# Patient Record
Sex: Male | Born: 1935 | Race: Black or African American | Hispanic: No | Marital: Single | Smoking: Former smoker
Health system: Southern US, Community
[De-identification: ages and names within clinical notes are randomized; demographics above are authoritative.]

## PROBLEM LIST (undated history)

## (undated) DIAGNOSIS — R011 Cardiac murmur, unspecified: Secondary | ICD-10-CM

## (undated) DIAGNOSIS — E7800 Pure hypercholesterolemia, unspecified: Secondary | ICD-10-CM

## (undated) DIAGNOSIS — I2510 Atherosclerotic heart disease of native coronary artery without angina pectoris: Secondary | ICD-10-CM

## (undated) DIAGNOSIS — I10 Essential (primary) hypertension: Secondary | ICD-10-CM

## (undated) HISTORY — PX: APPENDECTOMY: SHX54

## (undated) SURGERY — Surgical Case
Anesthesia: *Unknown

---

## 2003-12-23 ENCOUNTER — Other Ambulatory Visit: Payer: Self-pay

## 2010-02-18 ENCOUNTER — Inpatient Hospital Stay: Payer: Self-pay | Admitting: Internal Medicine

## 2010-12-25 ENCOUNTER — Emergency Department: Payer: Self-pay | Admitting: Emergency Medicine

## 2011-01-17 ENCOUNTER — Emergency Department: Payer: Self-pay | Admitting: Emergency Medicine

## 2011-07-02 ENCOUNTER — Emergency Department: Payer: Self-pay | Admitting: *Deleted

## 2012-01-01 ENCOUNTER — Emergency Department: Payer: Self-pay | Admitting: Unknown Physician Specialty

## 2012-01-01 LAB — URINALYSIS, COMPLETE
Bilirubin,UR: NEGATIVE
Glucose,UR: NEGATIVE mg/dL (ref 0–75)
Ketone: NEGATIVE
Ph: 7 (ref 4.5–8.0)
Protein: NEGATIVE
Specific Gravity: 1.006 (ref 1.003–1.030)
Squamous Epithelial: 1

## 2012-01-01 LAB — COMPREHENSIVE METABOLIC PANEL
Alkaline Phosphatase: 72 U/L (ref 50–136)
Anion Gap: 9 (ref 7–16)
BUN: 20 mg/dL — ABNORMAL HIGH (ref 7–18)
Chloride: 104 mmol/L (ref 98–107)
Creatinine: 1.44 mg/dL — ABNORMAL HIGH (ref 0.60–1.30)
EGFR (Non-African Amer.): 49 — ABNORMAL LOW
Osmolality: 285 (ref 275–301)
Potassium: 4.5 mmol/L (ref 3.5–5.1)
SGPT (ALT): 20 U/L
Sodium: 141 mmol/L (ref 136–145)
Total Protein: 8.6 g/dL — ABNORMAL HIGH (ref 6.4–8.2)

## 2012-01-01 LAB — CBC
MCH: 30.4 pg (ref 26.0–34.0)
MCV: 92 fL (ref 80–100)
Platelet: 183 10*3/uL (ref 150–440)
RDW: 16.6 % — ABNORMAL HIGH (ref 11.5–14.5)

## 2012-01-01 LAB — TROPONIN I: Troponin-I: 0.04 ng/mL

## 2012-07-28 ENCOUNTER — Emergency Department: Payer: Self-pay | Admitting: Emergency Medicine

## 2012-07-28 LAB — BASIC METABOLIC PANEL
Anion Gap: 9 (ref 7–16)
Calcium, Total: 9.3 mg/dL (ref 8.5–10.1)
Chloride: 110 mmol/L — ABNORMAL HIGH (ref 98–107)
Co2: 22 mmol/L (ref 21–32)
EGFR (Non-African Amer.): 38 — ABNORMAL LOW
Glucose: 159 mg/dL — ABNORMAL HIGH (ref 65–99)
Osmolality: 288 (ref 275–301)
Sodium: 141 mmol/L (ref 136–145)

## 2012-07-28 LAB — CBC
HCT: 43.9 % (ref 40.0–52.0)
MCHC: 32.7 g/dL (ref 32.0–36.0)
RBC: 4.73 10*6/uL (ref 4.40–5.90)
RDW: 16 % — ABNORMAL HIGH (ref 11.5–14.5)
WBC: 4.7 10*3/uL (ref 3.8–10.6)

## 2012-08-18 ENCOUNTER — Emergency Department: Payer: Self-pay | Admitting: Emergency Medicine

## 2012-08-18 LAB — URINALYSIS, COMPLETE
Bacteria: NONE SEEN
Bilirubin,UR: NEGATIVE
Hyaline Cast: 4
Nitrite: NEGATIVE
Ph: 5 (ref 4.5–8.0)
Protein: 100
Specific Gravity: 1.024 (ref 1.003–1.030)
Squamous Epithelial: 8

## 2012-08-18 LAB — COMPREHENSIVE METABOLIC PANEL
Albumin: 3.4 g/dL (ref 3.4–5.0)
Alkaline Phosphatase: 100 U/L (ref 50–136)
Anion Gap: 10 (ref 7–16)
BUN: 21 mg/dL — ABNORMAL HIGH (ref 7–18)
Bilirubin,Total: 0.4 mg/dL (ref 0.2–1.0)
Calcium, Total: 9.3 mg/dL (ref 8.5–10.1)
Chloride: 110 mmol/L — ABNORMAL HIGH (ref 98–107)
Glucose: 128 mg/dL — ABNORMAL HIGH (ref 65–99)
Osmolality: 291 (ref 275–301)
Potassium: 4.4 mmol/L (ref 3.5–5.1)
Total Protein: 7.9 g/dL (ref 6.4–8.2)

## 2012-08-18 LAB — CBC
HGB: 14.3 g/dL (ref 13.0–18.0)
MCH: 29.8 pg (ref 26.0–34.0)
MCHC: 32 g/dL (ref 32.0–36.0)
MCV: 93 fL (ref 80–100)
Platelet: 211 10*3/uL (ref 150–440)
WBC: 5 10*3/uL (ref 3.8–10.6)

## 2012-08-18 LAB — CK TOTAL AND CKMB (NOT AT ARMC): CK, Total: 109 U/L (ref 35–232)

## 2013-04-29 ENCOUNTER — Emergency Department: Payer: Self-pay | Admitting: Emergency Medicine

## 2013-04-29 LAB — CBC
HCT: 41.3 % (ref 40.0–52.0)
HGB: 13.9 g/dL (ref 13.0–18.0)
MCHC: 33.7 g/dL (ref 32.0–36.0)
Platelet: 242 10*3/uL (ref 150–440)
RBC: 4.35 10*6/uL — ABNORMAL LOW (ref 4.40–5.90)
RDW: 16 % — ABNORMAL HIGH (ref 11.5–14.5)

## 2013-04-29 LAB — BASIC METABOLIC PANEL
Anion Gap: 5 — ABNORMAL LOW (ref 7–16)
BUN: 18 mg/dL (ref 7–18)
Calcium, Total: 9.7 mg/dL (ref 8.5–10.1)
Chloride: 108 mmol/L — ABNORMAL HIGH (ref 98–107)
Co2: 27 mmol/L (ref 21–32)
EGFR (Non-African Amer.): 36 — ABNORMAL LOW
Glucose: 182 mg/dL — ABNORMAL HIGH (ref 65–99)
Sodium: 140 mmol/L (ref 136–145)

## 2013-04-29 LAB — URINALYSIS, COMPLETE
Glucose,UR: 50 mg/dL (ref 0–75)
Ketone: NEGATIVE
Nitrite: NEGATIVE
Specific Gravity: 1.023 (ref 1.003–1.030)
WBC UR: 12 /HPF (ref 0–5)

## 2013-05-23 ENCOUNTER — Emergency Department: Payer: Self-pay | Admitting: Emergency Medicine

## 2013-05-23 LAB — CBC
MCH: 30.8 pg (ref 26.0–34.0)
MCHC: 32.8 g/dL (ref 32.0–36.0)
MCV: 94 fL (ref 80–100)
Platelet: 241 10*3/uL (ref 150–440)
RBC: 4.02 10*6/uL — ABNORMAL LOW (ref 4.40–5.90)
RDW: 15.3 % — ABNORMAL HIGH (ref 11.5–14.5)

## 2013-05-23 LAB — COMPREHENSIVE METABOLIC PANEL
Anion Gap: 7 (ref 7–16)
BUN: 18 mg/dL (ref 7–18)
Bilirubin,Total: 0.5 mg/dL (ref 0.2–1.0)
Chloride: 109 mmol/L — ABNORMAL HIGH (ref 98–107)
Co2: 24 mmol/L (ref 21–32)
Creatinine: 1.62 mg/dL — ABNORMAL HIGH (ref 0.60–1.30)
EGFR (African American): 41 — ABNORMAL LOW
Glucose: 93 mg/dL (ref 65–99)
Osmolality: 281 (ref 275–301)
Potassium: 4.2 mmol/L (ref 3.5–5.1)
Total Protein: 8 g/dL (ref 6.4–8.2)

## 2013-07-31 ENCOUNTER — Emergency Department: Payer: Self-pay | Admitting: Emergency Medicine

## 2013-07-31 LAB — URINALYSIS, COMPLETE
Bilirubin,UR: NEGATIVE
Ph: 7 (ref 4.5–8.0)
Protein: 30
RBC,UR: 6 /HPF (ref 0–5)
Squamous Epithelial: 1

## 2013-10-26 ENCOUNTER — Observation Stay: Payer: Self-pay | Admitting: Internal Medicine

## 2013-10-26 LAB — CBC
MCH: 31 pg (ref 26.0–34.0)
MCHC: 33 g/dL (ref 32.0–36.0)
MCV: 94 fL (ref 80–100)
Platelet: 203 10*3/uL (ref 150–440)
RBC: 3.87 10*6/uL — ABNORMAL LOW (ref 4.40–5.90)
RDW: 17.2 % — ABNORMAL HIGH (ref 11.5–14.5)

## 2013-10-26 LAB — COMPREHENSIVE METABOLIC PANEL
Albumin: 3.6 g/dL (ref 3.4–5.0)
Alkaline Phosphatase: 80 U/L
Anion Gap: 9 (ref 7–16)
Co2: 24 mmol/L (ref 21–32)
Glucose: 181 mg/dL — ABNORMAL HIGH (ref 65–99)
Osmolality: 289 (ref 275–301)
Sodium: 141 mmol/L (ref 136–145)
Total Protein: 7.6 g/dL (ref 6.4–8.2)

## 2013-10-26 LAB — TROPONIN I: Troponin-I: 0.05 ng/mL

## 2013-10-26 LAB — CK TOTAL AND CKMB (NOT AT ARMC): CK-MB: 1 ng/mL (ref 0.5–3.6)

## 2013-10-27 LAB — CK TOTAL AND CKMB (NOT AT ARMC)
CK, Total: 75 U/L (ref 35–232)
CK-MB: 1.7 ng/mL (ref 0.5–3.6)
CK-MB: 2.2 ng/mL (ref 0.5–3.6)

## 2013-10-27 LAB — LIPID PANEL: VLDL Cholesterol, Calc: 18 mg/dL (ref 5–40)

## 2013-10-27 LAB — TROPONIN I: Troponin-I: 0.08 ng/mL — ABNORMAL HIGH

## 2014-07-26 ENCOUNTER — Emergency Department: Payer: Self-pay | Admitting: Emergency Medicine

## 2014-08-29 ENCOUNTER — Emergency Department: Payer: Self-pay | Admitting: Emergency Medicine

## 2014-08-29 LAB — TROPONIN I
TROPONIN-I: 0.02 ng/mL
Troponin-I: <0.02 ng/mL

## 2014-08-29 LAB — BASIC METABOLIC PANEL
Anion Gap: 7 (ref 7–16)
BUN: 20 mg/dL — ABNORMAL HIGH (ref 7–18)
Calcium, Total: 8.3 mg/dL — ABNORMAL LOW (ref 8.5–10.1)
Chloride: 113 mmol/L — ABNORMAL HIGH (ref 98–107)
Co2: 26 mmol/L (ref 21–32)
Creatinine: 1.39 mg/dL — ABNORMAL HIGH (ref 0.60–1.30)
EGFR (African American): >60
EGFR (Non-African Amer.): 50 — ABNORMAL LOW
GLUCOSE: 130 mg/dL — AB (ref 65–99)
Osmolality: 295 (ref 275–301)
Potassium: 3.8 mmol/L (ref 3.5–5.1)
Sodium: 146 mmol/L — ABNORMAL HIGH (ref 136–145)

## 2014-08-29 LAB — CBC
HCT: 36.5 % — ABNORMAL LOW (ref 40.0–52.0)
HGB: 11.9 g/dL — ABNORMAL LOW (ref 13.0–18.0)
MCH: 31.7 pg (ref 26.0–34.0)
MCHC: 32.7 g/dL (ref 32.0–36.0)
MCV: 97 fL (ref 80–100)
PLATELETS: 190 10*3/uL (ref 150–440)
RBC: 3.77 10*6/uL — ABNORMAL LOW (ref 4.40–5.90)
RDW: 17 % — ABNORMAL HIGH (ref 11.5–14.5)
WBC: 5.4 10*3/uL (ref 3.8–10.6)

## 2014-08-29 LAB — PROTIME-INR
INR: 1
PROTHROMBIN TIME: 12.8 s (ref 11.5–14.7)

## 2014-10-11 ENCOUNTER — Emergency Department: Payer: Self-pay | Admitting: Internal Medicine

## 2015-01-18 ENCOUNTER — Emergency Department: Payer: Self-pay | Admitting: Emergency Medicine

## 2015-03-11 ENCOUNTER — Observation Stay
Admit: 2015-03-11 | Disposition: A | Discharge status: Home / Self Care | Payer: Self-pay | Attending: Internal Medicine | Admitting: Internal Medicine

## 2015-03-11 LAB — BASIC METABOLIC PANEL
Anion Gap: 5 — ABNORMAL LOW (ref 7–16)
BUN: 35 mg/dL — ABNORMAL HIGH
CALCIUM: 9.3 mg/dL
CHLORIDE: 107 mmol/L
Co2: 27 mmol/L
Creatinine: 1.64 mg/dL — ABNORMAL HIGH
GFR CALC AF AMER: 41 — AB
GFR CALC NON AF AMER: 35 — AB
Glucose: 110 mg/dL — ABNORMAL HIGH
Potassium: 4 mmol/L
SODIUM: 139 mmol/L

## 2015-03-11 LAB — URINALYSIS, COMPLETE
Bilirubin,UR: NEGATIVE
Blood: NEGATIVE
GLUCOSE, UR: NEGATIVE mg/dL (ref 0–75)
Ketone: NEGATIVE
Nitrite: NEGATIVE
Ph: 5 (ref 4.5–8.0)
Protein: 30
Specific Gravity: 1.025 (ref 1.003–1.030)

## 2015-03-11 LAB — CK TOTAL AND CKMB (NOT AT ARMC)
CK, Total: 74 U/L
CK-MB: 3.4 ng/mL

## 2015-03-11 LAB — CBC
HCT: 38.9 % — ABNORMAL LOW (ref 40.0–52.0)
HGB: 12.7 g/dL — ABNORMAL LOW (ref 13.0–18.0)
MCH: 30.5 pg (ref 26.0–34.0)
MCHC: 32.7 g/dL (ref 32.0–36.0)
MCV: 93 fL (ref 80–100)
Platelet: 206 10*3/uL (ref 150–440)
RBC: 4.17 10*6/uL — AB (ref 4.40–5.90)
RDW: 17.3 % — ABNORMAL HIGH (ref 11.5–14.5)
WBC: 6.6 10*3/uL (ref 3.8–10.6)

## 2015-03-11 LAB — PRO B NATRIURETIC PEPTIDE: B-Type Natriuretic Peptide: 77 pg/mL

## 2015-03-11 LAB — TROPONIN I
TROPONIN-I: 0.03 ng/mL
Troponin-I: 0.03 ng/mL
Troponin-I: 0.03 ng/mL

## 2015-03-13 LAB — URINE CULTURE

## 2015-03-14 NOTE — Discharge Summary (Signed)
PATIENT NAME:  Victor Dillon, Victor Dillon MR#:  [id number] DATE OF BIRTH:  [birth year]  DATE OF ADMISSION:  10/26/2013 DATE OF DISCHARGE:  10/27/2013  ADMITTING DIAGNOSIS: Chest pain.   DISCHARGE DIAGNOSES: 1.  Chest pain felt to be atypical in nature, seen by Cardiology who recommended outpatient follow-up.  2.  Hyperlipidemia.  3.  Hypertension.  4.  Benign prostatic hypertrophy.  5.  History of coronary artery disease with 40% lesion in the LAD found in 2005.   CONSULTANTS: Dr. Ubaldo Glassing.   PERTINENT LABORATORY EVALUATION:  Admitting glucose 181, BUN 21, creatinine 1.70, sodium 141, potassium 3.8, chloride 108, CO2 24, calcium 9.4. LFTs are normal. Troponin was 0.03, 0.05 and 0.08. WBC count of 6.0, hemoglobin 12, platelet count 203. Chest x-ray shows mild right basilar atelectasis. EKG shows sinus tachycardia with a heart rate of 103 with lateral T wave inversions.   HOSPITAL COURSE: Please refer to H and P done by the admitting physician. The patient is a 81 year old African American male with history of hypertension, hyperlipidemia, coronary artery disease, who presented with chest pain. The patient described it as a retrosternal pain which was brief in nature. The patient came to the ED and he was monitored. He had sets of cardiac enzymes and his troponin did come back a little elevated. Therefore, he was admitted under observation. Cardiology consult was obtained. They evaluated the patient and felt that he likely had atypical pain and with his elevated creatinine, he would not be a good candidate for intervention anyway.  Therefore recommended medical management. The patient has not had any further chest pain. He is doing well. He is stable for discharge.   DISCHARGE MEDICATIONS: Amlodipine 10 daily, simvastatin 20 at bedtime, aspirin 81 one tab p.o. daily, Colace and Senna 1 tab p.o. b.i.d. as needed, terazosin 5 mg at bedtime, acetaminophen 650 t.i.d. as needed for pain, calcium with vitamin D 1 tab  p.o. b.i.d. capsaicin topical   topically apply to left knee b.i.d. for arthritis, lubricant eyedrops 1 drop to each eye once a day in the evening, finasteride 2 mg daily, lidocaine topical 5% daily, hydralazine 50, one tab p.o. t.i.d., lisinopril 20 daily, vitamin D3 1000 international units daily, metoprolol tartrate 25 mg 1 tab p.o. b.i.d., nitroglycerin 0.4 sublingual p.r.n.   DIET: Low sodium.   ACTIVITY: As tolerated.   FOLLOW-UP:  With primary M.D. in 1 to 2 weeks. Follow up with Dr. Ubaldo Glassing in 2 to 4 weeks.   TIME SPENT:  35 minutes.  ____________________________ Lafonda Mosses Posey Pronto, MD shp:dp D: 10/27/2013 13:00:13 ET T: 10/27/2013 16:48:16 ET JOB#: [id number]  cc: Finnley Larusso H. Posey Pronto, MD, <Dictator> Alric Seton MD ELECTRONICALLY SIGNED 11/02/2013 12:48

## 2015-03-14 NOTE — Consult Note (Signed)
Present Illness 81 yo male followed at the [facility] admitted with chest pain and mild serum troponin of 0.03, 0.05 and 0.08. Urgent cardiology consultation called by ward secretary to see patinet regarding his tropoinin and chest pain. Pt had cardiac catheterization in 2005 per Dr. Andi Devon revealing insignificant disease with normal rca, lcx and lm with a less than 50% stenosis in lad. Pt decribed 8/10 chest pain. Pain has resolved. EKG is unremarkable with no ischemia noted. Pt states the pain lasted approximately one hour and occurred after eating dinner. He states he had eaten a pork chop sandwich and following this had approximately one hour of chest pain. It was in the center of his chest and did not radiate. He had not felt pain similar to this in the past. He states he is compliant with his meds. Risk factors for cad include hypertension, hyperlipidemia. He had runs of svt in the er. He is currently pain free. He is able to ambulate with no chest pain.   Physical Exam:  GEN well developed, well nourished, no acute distress   HEENT PERRL, poor dentition   NECK supple    RESP clear BS  no use of accessory muscles    CARD Regular rate and rhythm  No murmur    ABD denies tenderness  normal BS  no Adominal Mass    LYMPH negative neck, negative axillae   EXTR negative cyanosis/clubbing, negative edema   SKIN normal to palpation   NEURO cranial nerves intact, motor/sensory function intact   PSYCH A+O to time, place, person   Review of Systems:  Subjective/Chief Complaint one hour of chest pain   General: No Complaints    Skin: No Complaints    ENT: No Complaints    Eyes: No Complaints    Neck: No Complaints    Respiratory: No Complaints    Cardiovascular: No Complaints    Gastrointestinal: No Complaints    Genitourinary: No Complaints    Vascular: No Complaints    Musculoskeletal: No Complaints    Neurologic: No Complaints    Hematologic: No Complaints     Endocrine: No Complaints    Psychiatric: No Complaints    Review of Systems: All other systems were reviewed and found to be negative    Medications/Allergies Reviewed Medications/Allergies reviewed    Home Medications:  Medication Instructions Status  docusate-senna 50 mg-8.6 mg oral tablet 1 tab(s) orally 2 times a day as needed for constipation Active  terazosin 1 mg oral capsule 5 caps (75m) orally once a day (at bedtime) Active  acetaminophen 325 mg oral tablet 2 tabs (6546m orally 3 times a day Active  Calcarb with D 600 mg-400 intl units oral tablet 1 tab(s) orally 2 times a day Active  capsaicin topical 0.075% topical cream Apply topically to left knee 2 times a dayfor arthritis pain. Active  Lubricant Eye Drops 1 application (thin ribbon) of ointment to each eye once a day (in the evening) Active  amlodipine 10 mg oral tablet 1 tab(s) orally once a day for blood pressure Active  simvastatin 40 mg oral tablet 0.5 tab (2020morally once a day (at bedtime) for cholesterol Active  aspirin 81 mg oral delayed release tablet 1 tab(s) orally once a day Active  finasteride 1 mg oral tablet 2 tabs (2mg50mrally once a day Active  lidocaine topical 5% topical ointment Apply moderate amount topically  once a day Active  hydrALAZINE 50 mg oral tablet 1 tab(s) orally 3  times a day Active  lisinopril 40 mg oral tablet 0.5 tabs (76m) orally once a day Active  Vitamin D3 1000 intl units oral tablet 1 tab(s) orally once a day Active   EKG:  EKG NSR    Abnormal NSSTTW changes     No Known Allergies:    Impression 81yo male with history of hypertension, hyperlipidemia and insignificant coronary artery disease by cardiac cath in 2005 who is followed at the [facility]. Was admitted after presenting to the er with one hour of chst pain occurring after eating. Described the pain as midsternal without radiation No diapharesis, nausea or vomiting. Minimal tropinin elevation to 0.08. Had brief runs  of nonsustained svt in er. Also has CKD with eGFR of 39. EKG does not reveal any significant ischemia. Has had no further arrythmia since admission. Pain is somewhat atypical and may represent esophageal spasm due to high fat food intake. Elevated werum tropoin is of some concern but likely represents demand ischeia given symtpoms as well as a component of CKD. Would not recommend invasive cardiac workup given symtpoms, troponin and renal insuffiency. Will continue to treat with asa, statin and add low dose beta blocker. Ambulate in hall and consider discharge if stable with out patinet follow up with his primary cardiologist at the [facility] or inour office.   Plan 1. Continue current meds including asa, statin, ace i, and beta blockers 2. Ambulate and follow for further symptoms or arrythmia 3.If stable, would consider discharge with outpatient follow up with his primary cardiologist or in our office if desired.   Electronic Signatures: FTeodoro Spray(MD)  (Signed 06-Dec-14 07:57)  Authored: General Aspect/Present Illness, History and Physical Exam, Review of System, Home Medications, EKG , Allergies, Impression/Plan   Last Updated: 06-Dec-14 07:57 by FTeodoro Spray(MD)

## 2015-03-14 NOTE — H&P (Signed)
PATIENT NAME:  Victor Dillon, Victor Dillon MR#:  [id number] DATE OF BIRTH:  12/20/1916  DATE OF ADMISSION:  10/26/2013  REFERRING PHYSICIAN: Dr. Corky Downs.   PRIMARY CARE PHYSICIAN:  [place].   CHIEF COMPLAINT: Chest pain.   This is a 81 year old African American gentleman with history of hypertension, hyperlipidemia, coronary artery disease, who is presenting with chest pain. Described this as a retrosternal pain, pressure and quality, 8 out of 10 in intensity and nonradiating. No worsening or relieving factors without associated symptoms, including shortness of breath, palpitations, diaphoresis and nausea. This pain lasted approximately 1 hour in total. It started acutely as he just finished eating dinner. He denies having any prior chest pain symptoms, any exertional symptoms or any anginal symptoms. In the Emergency Department, initial EKG and enzymes were within normal limits. However, he did have runs of sinus tachycardia, heart rate up to 150. He is currently without complaints.   REVIEW OF SYSTEMS:  CONSTITUTIONAL: Denies fever, fatigue, weakness, pain.  EYES: Denies blurred vision, double vision, eye pain.  EARS, NOSE, THROAT: Denies tinnitus, ear pain, hearing loss.  RESPIRATORY: Denies cough, wheeze, shortness of breath.  CARDIOVASCULAR: Positive for chest pain as described above. Denies any orthopnea, edema, palpitations.  GASTROINTESTINAL: Denies nausea, vomiting, diarrhea, abdominal pain.  GENITOURINARY: Denies dysuria, hematuria.  ENDOCRINE: Denies nocturia or polyuria.  HEMATOLOGIC AND LYMPHATIC: Denies easy bruising or bleeding.  SKIN: Denies any rashes, lesions.  MUSCULOSKELETAL: Positive for chronic pain in his left knee, otherwise denies any neck, back, shoulder or hip pain, any current arthritic symptoms.  NEUROLOGIC: Denies any paralysis, paresthesias.  PSYCHIATRIC: Denies any anxiety or depressive symptoms. Otherwise, full review of systems performed by me is negative.   PAST  MEDICAL HISTORY: Hyperlipidemia, hypertension, BPH, coronary artery disease with known 40% lesion of the LAD, originally found back in 2005 on cardiac catheterization, per our records.   FAMILY HISTORY: Denies any history of cardiovascular disease including coronary artery disease.   SOCIAL HISTORY: Ex-tobacco use, has not smoked in approximately 50 years. Denies any alcohol or drug usage. Currently resides in an assisted living facility. Ambulates with a walker at baseline.   ALLERGIES: No known drug allergies.   HOME MEDICATIONS: Include acetaminophen 325 mg 2 tablets 3 times daily, Norvasc 10 mg p.o. daily, calcium 600 mg/vitamin D 400 International Units 1 tab p.o. b.i.d., capsaicin 0.075% cream to knee as needed for arthritic symptoms, Colace, Senna-S 8.6/50 mg p.o. b.i.d. for constipation, finasteride 1 mg 2 tablets p.o. daily, , simvastatin 20 mg p.o. daily, terazosin 5 mg p.o. at bedtime, hydralazine 50 mg p.o. 3 times daily, lisinopril 20 mg p.o. daily.   PHYSICAL EXAMINATION: VITAL SIGNS: Temperature 97.6, heart rate 102, respirations 16, blood pressure 142/83, saturating 98% on room air. Weight 72.6 kg, BMI 24.3.  GENERAL: Well-nourished, well-developed, African American gentleman who is currently in no acute distress.  HEAD: Normocephalic, atraumatic.  EYES: Pupils equal, round and reactive to light.  Extraocular muscles intact. No scleral icterus.  MOUTH: Moist mucosal membranes. Poor dentition. No abscess noted.  EAR, NOSE, THROAT: Throat clear without exudates. No external lesions.  NECK: Supple. No thyromegaly. No nodules. No JVD.  PULMONARY: Clear to auscultation bilaterally. No wheezes, rubs or rhonchi. No use of accessory muscles. Good respiratory effort.  CHEST: Nontender to palpation.  CARDIOVASCULAR: S1, S2, regular rate and rhythm, 3/6 systolic ejection murmur, best heard at right upper sternal border. No edema. Pedal pulses 2+ bilaterally.  GASTROINTESTINAL: Soft,  nontender, nondistended. No masses. Positive  bowel sounds. No hepatosplenomegaly.  MUSCULOSKELETAL: No swelling, clubbing or edema. Range of motion full in all extremities.  NEUROLOGIC: Cranial nerves II through XII intact. No gross focal neurological deficits. Sensation intact. Reflexes intact.  SKIN: No ulcerations, lesions, rashes or cyanosis.  SKIN:  Warm, dry. Turgor is intact.  PSYCHIATRIC: Mood and affect within normal limits. The patient awake, alert and oriented x 3. Insight and judgment intact.   LABORATORY DATA: Sodium 141, potassium 3.8, chloride 108, bicarb 24, BUN 21, creatinine 1.7, glucose 181. LFTs within normal limits. Troponin I 0.03. WBC 6, hemoglobin 12, platelets of 203.   EKG: Sinus tachycardia, heart rate of 103 with lateral T inversions, which appear unchanged from previous EKGs.    ASSESSMENT AND PLAN: A 81 year old African American gentleman with history of hypertension, hyperlipidemia, coronary artery disease, presenting with chest pain.  1.  Chest pain. EKG and enzymes within normal limits. We will admit to observation with cardiac telemetry. Trend cardiac enzymes. Check lipids. He has already been given aspirin.  We will continue aspirin, as well as his statin.  2.  Hypertension. Continue with his Norvasc, lisinopril, Lopressor and hydralazine.  3.  Hyperlipidemia. Continue with Zocor.  4.  Benign prostatic hypertrophy. Continue Proscar and terazosin.  5. Deep venous thrombosis prophylaxis with heparin subQ.   The patient is FULL CODE.   TIME SPENT: 45 minutes.    ____________________________ Aaron Mose. Danish Ruffins, MD dkh:dmm D: 10/26/2013 22:24:53 ET T: 10/26/2013 22:39:46 ET JOB#: [id number]  cc: Aaron Mose. Leaira Fullam, MD, <Dictator> Lavaun Greenfield Woodfin Ganja MD ELECTRONICALLY SIGNED 10/27/2013 20:35

## 2015-03-23 NOTE — H&P (Signed)
PATIENT NAME:  Victor Dillon, Victor Dillon MR#:  [id number] DATE OF BIRTH:  Oct 12, 1917  DATE OF ADMISSION:  03/11/2015  CHIEF COMPLAINT:  Chest pain.   HISTORY OF PRESENT ILLNESS:  This is a 81 year old gentleman who states that he was sitting at home in his chair watching television, when he had sudden onset of chest tightness. This was in the left sternal region and left side region. It lasted 30 minutes and self-resolved. He did not take anything for it. It was not exertional, as he was not moving around at the time. He states that it did not radiate. He did have some profuse sweating during this episode, but he denies any overt shortness of breath, nausea, vomiting, or abdominal pain. Denies radiation. He states that he has never had pain like this before. He states he does have a history of aortic stenosis murmur, but never had pain like this in the past, so he came to the ED to be evaluated. In the ED, he was found to have a negative troponin first set, normal BNP, but given his story, it was felt that he should be brought in for rule out of ACS for his episode of angina. Hospitalists were called for admission.   PRIMARY CARE PHYSICIAN:  Nonlocal.   PAST MEDICAL HISTORY:  Aortic stenosis, BPH, hyperlipidemia, hypertension.   CURRENT MEDICATIONS:  The patient cannot verify these medications. He did not have medications with him or a list and was unable to recall exactly which medicines he is on. He states that he has a caregiver at home who helps him with this, but we have listed terazosin 1 mg 5 caps daily, simvastatin 40 mg daily, MiraLax p.r.n., lisinopril 20 mg half tablet daily, hydralazine 25 mg t.i.d., finasteride 1 mg 2 tabs daily, Colace/senna combo p.r.n., capsaicin topical as needed, Norvasc 10 mg daily, Tylenol p.r.n.   PAST SURGICAL HISTORY:  Appendectomy.   ALLERGIES:  No known drug allergies.   FAMILY HISTORY:  The patient denies knowing about any family medical conditions.   SOCIAL  HISTORY:  Ex-smoker, quit 50 years ago. Denies alcohol or illicit drug use.   REVIEW OF SYSTEMS: CONSTITUTIONAL:  Denies fever, fatigue, or weakness.  EYES:  Denies blurred or double vision, pain, or redness.  EARS, NOSE, AND THROAT:  Denies ear pain, hearing loss, or difficulty swallowing.  RESPIRATORY:  Denies cough, dyspnea, or painful respiration.  CARDIOVASCULAR:  Endorses episode of chest pain. Denies edema or palpitations.  GASTROINTESTINAL:  Denies nausea, vomiting, diarrhea, abdominal pain, or constipation.  GENITOURINARY:  Denies dysuria, hematuria, or frequency.  ENDOCRINE:  Denies nocturia, thyroid problems, or heat or cold intolerance.  HEMATOLOGIC AND LYMPHATIC:  Denies easy bruising or bleeding or swollen glands.  INTEGUMENTARY:  Denies acne, rash, or lesion.  MUSCULOSKELETAL:  Denies acute arthritis, joint swelling, or gout.  NEUROLOGICAL:  Denies numbness, weakness, or headache.  PSYCHIATRIC:  Denies anxiety, insomnia, or depression.   PHYSICAL EXAMINATION: VITAL SIGNS:  Blood pressure 133/67, pulse 79, temperature 97.9, respirations 14, and 96% O2 saturations on room air.  GENERAL:  This is a thin but healthy-appearing gentleman lying supine in bed in no acute distress.  HEENT:  Pupils are equal, round, and reactive to light and accommodation. Extraocular movements are intact. No scleral icterus. Moist mucosal membranes.  NECK:  Thyroid is not enlarged. Neck is supple and nontender. No masses. No cervical adenopathy. No JVD.  RESPIRATORY:  Clear to auscultation bilaterally. No rales, rhonchi, or wheezing. No respiratory distress.  CARDIOVASCULAR:  Regular rate and rhythm. He has a 3/6 systolic murmur heard loudest at the right upper sternal border. He has good pedal pulses with no lower extremity edema.  ABDOMEN:  Soft, nontender, nondistended. Good bowel sounds.  MUSCULOSKELETAL:  Muscular strength is 5/5 in all 4 extremities. Full spontaneous range of motion throughout.  No cyanosis or clubbing.  SKIN:  No rash or lesions. Skin is warm, dry, and intact.  LYMPHATIC:  No adenopathy.  NEUROLOGIC:  Cranial nerves are intact. Sensation is intact throughout. No dysarthria or aphasia.  PSYCHIATRIC:  Alert and oriented x 3, cooperative, not confused or agitated.   LABORATORY DATA:  White count is 6.6, hemoglobin 12.7, hematocrit 38.9, and platelets are 206,000. Sodium is 139, potassium 4.0, chloride 107, CO2 of 27, BUN 35, creatinine 1.64, glucose 110, and calcium is 9.3. BNP is 77. Troponin is 0.03.   No radiography as of yet this admission. Chest x-ray is pending.   ASSESSMENT AND PLAN: 1.  Angina at rest. Troponin is negative so far. We will trend his cardiac enzymes. We will get an echocardiogram and a cardiology consult tomorrow. The patient has no known history of heart disease. He does have aortic stenosis, which can also be evaluated on echocardiogram tomorrow.  2.  Aortic stenosis. As per above, it will be evaluated on echocardiogram. We will defer to cardiology input after the study is done.  3.  Benign prostatic hypertrophy. Continue the patient's home medications for this. These medications will need to be verified; however, it appears at this time that he is on finasteride for this.  4.  Hypertension. Again, continue the patient's home medications. We will only continue a couple of them at this time to control his blood pressure until his medications can be verified, at which point they can all be implemented.  5.  Hyperlipidemia. Continue the patient's statin medication for this.  6.  Deep vein thrombosis prophylaxis with subcutaneous heparin.   CODE STATUS:  The patient is a full code.   TIME SPENT ON THIS ADMISSION:  45 minutes.   ____________________________ Wilford Corner. Jannifer Franklin, MD dfw:nb D: 03/11/2015 02:18:27 ET T: 03/11/2015 02:53:57 ET JOB#: [id number]  cc: Wilford Corner. Jannifer Franklin, MD, <Dictator> [place] Veldman Fawn Kirk MD ELECTRONICALLY SIGNED 03/11/2015 5:31

## 2015-03-23 NOTE — Discharge Summary (Addendum)
PATIENT NAME:  Victor Dillon, Victor Dillon MR#:  [id number] DATE OF BIRTH:  [birth year]  DATE OF ADMISSION:  03/11/2015 DATE OF DISCHARGE:  03/11/2015  ADMITTING DIAGNOSIS: Chest pain.   DISCHARGE DIAGNOSES:  1.  Chest pain of unclear etiology at this time. Cannot rule out coronary artery disease.  2.  Malignant essential hypertension while in the hospital likely due to mix up in home medication list.  3.  Chronic renal failure.  4.  Urinary tract infection.  Cultures pending. 5.  History of benign prostatic hypertrophy, hyperlipidemia, hypertension.   DISCHARGE CONDITION: Stable.   DISCHARGE MEDICATIONS: The patient is to continue amlodipine 10 mg p.o. daily, terazosin 5 mg p.o. at bedtime acetaminophen 325 mg 2 capsules 3 times daily, calcium with vitamin D 1 tablet twice daily, capsaicin topical cream 0.075% twice daily, docusate Senna 50/8.6 one tablet twice daily, finasteride 2 mg 2 daily, lidocaine topical ointment 5% once daily, ophthalmic ointment for moisturizing ophthalmic ointment each affected eye once at bedtime, simvastatin 40 mg 1/2 tablet which would be 10 mg at bedtime, cholecalciferol 1000 units once daily, capsaicin topical cream once daily to affected areas, MiraLax 17 grams once daily as needed, lisinopril 10 mg p.o. daily, aspirin 81 mg per day, trazodone 50 mg 3 times daily, Keflex 250 mg every 8 hours for 5 days.   HOME OXYGEN:  None.   DIET: 2 grams salt, low fat, low cholesterol, regular consistency.   ACTIVITY LIMITATIONS:  As tolerated.   FOLLOWUP APPOINTMENTS: Primary care physician at [facility] in 2 days after discharge.  Nephrology, Dr. Candiss Norse, 2 days after discharge.  CONSULTANTS: Care management, social work, and also Dr. Nehemiah Massed.   RADIOLOGIC STUDIES: Chest x-ray, portable, single view, 03/11/2015 showed no active cardiopulmonary disease. Stable cardiomegaly. Echocardiogram 03/11/2015 revealing left interval ejection fraction by visual estimation 60 to 65%,  normal global left ventricular systolic function, decreased left ventricular internal cavity size, mildly dilated left atrium, mildly dilated right atrium, mild to moderate mitral valve regurgitation, mild aortic valve stenosis, mild to moderate tricuspid regurgitation, moderately increased left ventricular posterior wall thickness.   HOSPITAL COURSE: The patient is a 81 year old African American male with history of hypertension, BPH, as well as hyperlipidemia who presents to the hospital with complaints of chest pain. Please refer to Dr. Jannifer Franklin' admission note done on 03/11/2015. On arrival to the hospital, the patient's temperature was 97.9, pulse was 79, respiration was 14, blood pressure 133/67, saturation was 96% on room air.  Physical exam was unremarkable except systolic murmur which was heard on the right side of the heart.  LABORATORY DATA: Done on arrival revealed elevated BUN and creatinine to 35 and 1.64, glucose 110, otherwise BMP was unremarkable. Cardiac enzymes first set as well as subsequent 2 more sets were normal. The patient's white blood cell count was normal at 6.6, hemoglobin was 12.7, platelet count was 206,000.  Urinalysis, however, revealed 3+ leukocyte esterase, from 0 to 5 red blood cells, too numerous to count white blood cells, many bacteria, 0 to 5 epithelial cells. Mucus was present as well as hyaline casts and amorphous crystals.  The patient's urine specific gravity was 1.025, hazy, yellow color.  Negative for glucose, bilirubin, or ketones, negative for blood, 30 mg/dL protein, negative for nitrites and as mentioned above 3+ leukocyte esterase.   HOSPITAL COURSE:  The patient was admitted to the hospital for further evaluation. His cardiac enzymes were cycled and cardiology consultation with Dr. Nehemiah Massed was obtained. Echocardiogram was performed per  Dr. Nehemiah Massed recommendations. Echocardiogram revealed moderate valvular disease with mild to moderate mitral as well as mild  to moderate tricuspid regurgitation, as well as mild aortic valve stenosis, as well as moderately increased left lateral posterior wall thickness. The patient did have a normal ejection fraction. Dr. Nehemiah Massed felt that the patient's chest pain likely noncardiac; however, we were not able to completely rule out underlying coronary artery disease. For this reason, we recommended starting the patient on aspirin therapy. The patient is advised to follow up with his primary care physician for further recommendations.  In regards to his blood pressure issues, the patient's blood pressure was high while he was in the hospital.  Apparently, the patient's hydralazine was not started and used lisinopril.  Dose was lower than he usually takes at home.  Restarting his medications, his blood pressure normalized. In regard to chronic renal failure, it was felt that the patient's chronic renal failure should be followed by nephrologist and nephrology consultation as outpatient will be ordered for him upon discharge. In regards to urinary tract infection, urine culture was taken and patient was initiated on Keflex.  The patient is to continue Keflex for 5 days and follow up with his primary care physician for his chronic medical problems.  Hyperlipidemia and BPH, the patient is to continue his outpatient medications.  Postvoid bladder scan showed urine volume of approximately 150 mL.  The patient is being discharged in stable condition with the above-mentioned medications and followup. On the day of discharge, temperature was 97.9, pulse was 65, respiration was 18, blood pressure 134/69, saturation was 100% on room air at rest.   TIME SPENT: 40 minutes.   ____________________________ Theodoro Grist, MD rv:sp D: 03/11/2015 16:27:27 ET T: 03/11/2015 17:02:07 ET JOB#: [id number]  cc: Theodoro Grist, MD, <Dictator> [facility]   [place] MD ELECTRONICALLY SIGNED 03/21/2015 18:20

## 2015-03-23 NOTE — Consult Note (Signed)
PATIENT NAME:  Victor Dillon, Victor Dillon MR#:  [id number] DATE OF BIRTH:  Jun 20, 1917  DATE OF CONSULTATION:  03/11/2015  REFERRING PHYSICIAN:   CONSULTING PHYSICIAN:  Corey Skains, MD  CONSULTING PHYSICIAN:  Lance Coon, MD.    REASON FOR CONSULTATION: Unstable angina with aortic valve stenosis and essential hypertension.   CHIEF COMPLAINT:  I got chest pain.  HISTORY OF PRESENT ILLNESS: This is a 81 year old male with known essential hypertension, aortic valve stenosis to a moderate degree in the past, who has had appropriate medication management including statin therapy with hyperlipidemia, amlodipine, and ACE inhibitor for hypertension control which has been fine.  The patient has had new onset of a significant amount of substernal chest discomfort radiating into his left back and some of his shoulder, waxing and waning over the last 24 hours and now completely relieved. The patient has had an EKG showing normal sinus rhythm and no evidence of myocardial infarction by troponin being normal. The patient now has full relief of chest pain and has not had any heart failure symptoms or significant lower extremity edema.   REVIEW OF SYSTEMS:  Remainder of review of systems negative for vision change, ringing in the ear, hearing loss, cough, congestion, heartburn, nausea, vomiting, diarrhea, bloody stool, stomach pain, extremity pain, leg weakness, cramping of the buttocks, known blood clots, headaches, blackouts, dizzy spells, nosebleeds, congestion, trouble swallowing, frequent urination, urination at night, muscle weakness, anxiety.   PAST MEDICAL HISTORY:  1. Moderate aortic stenosis.  2. Essential hypertension.  3. Mixed hyperlipidemia.   FAMILY HISTORY: Father and mother had hypertension, but no other early onset of cardiovascular disease.   SOCIAL HISTORY: The patient currently denies alcohol or tobacco use.   ALLERGIES: AS LISTED.   MEDICATIONS: As listed.   PHYSICAL EXAMINATION:    GENERAL APPEARANCE:  The patient is a well-appearing male in no acute distress.  HEENT: No icterus, thyromegaly, ulcers, hemorrhage, or xanthelasma.  CARDIOVASCULAR: Regular rate and rhythm with normal S1, soft S2, with a 3 out of 6 right upper sternal border radiating throughout. PMI is inferiorly displaced. Carotid upstroke normal with a bruit. Jugular venous pressure not seen.  LUNGS: Have some expiratory wheezes, but otherwise normal.  ABDOMEN: Soft, nontender, without hepatosplenomegaly or masses. Abdominal aorta is normal size without bruit.  EXTREMITIES: Show 2 + radial, femoral, dorsal pedal pulses, with trace lower extremity edema. No cyanosis, clubbing, or ulcers.  NEUROLOGIC: He is oriented to time, place, and person, with normal mood and affect.   ASSESSMENT: A 81 year old male with atypical chest discomfort, with known cardiovascular risk factors and no current evidence of myocardial infarction, aortic valve stenosis nonrheumatic, with essential hypertension, mixed hyperlipidemia.   RECOMMENDATIONS:  1.  Begin ambulation and follow for chest discomfort and other symptoms.  2.  Continue amlodipine and ACE inhibitor for hypertension control, would consider the possibility of beta blocker for further risk reduction of angina.  3.  Echocardiogram for extent of LV systolic dysfunction, valvular heart disease contributing to above.  4.  No other further invasive diagnostic studies at this time due to no evidence of myocardial infarction and the patient has advanced age.    ____________________________ Corey Skains, MD bjk:bu D: 03/11/2015 13:14:01 ET T: 03/11/2015 13:26:27 ET JOB#: [id number]  cc: Corey Skains, MD, <Dictator> Corey Skains MD ELECTRONICALLY SIGNED 03/12/2015 8:58

## 2015-05-17 ENCOUNTER — Encounter: Payer: Self-pay | Admitting: Emergency Medicine

## 2015-05-17 ENCOUNTER — Emergency Department
Admission: EM | Admit: 2015-05-17 | Discharge: 2015-05-17 | Disposition: A | Discharge status: Home / Self Care | Payer: Medicare Other | Attending: Emergency Medicine | Admitting: Emergency Medicine

## 2015-05-17 ENCOUNTER — Emergency Department: Payer: Medicare Other

## 2015-05-17 DIAGNOSIS — Z87891 Personal history of nicotine dependence: Secondary | ICD-10-CM | POA: Diagnosis not present

## 2015-05-17 DIAGNOSIS — K5900 Constipation, unspecified: Secondary | ICD-10-CM | POA: Insufficient documentation

## 2015-05-17 DIAGNOSIS — I10 Essential (primary) hypertension: Secondary | ICD-10-CM | POA: Diagnosis not present

## 2015-05-17 DIAGNOSIS — Z9889 Other specified postprocedural states: Secondary | ICD-10-CM | POA: Insufficient documentation

## 2015-05-17 HISTORY — DX: Essential (primary) hypertension: I10

## 2015-05-17 MED ORDER — POLYETHYLENE GLYCOL 3350 17 GM/SCOOP PO POWD: ORAL | Status: DC

## 2015-05-17 MED ORDER — DOCUSATE SODIUM 100 MG PO CAPS: 200.0000 mg | ORAL_CAPSULE | Freq: Two times a day (BID) | ORAL | Status: AC

## 2015-05-17 MED ORDER — SENNA 8.6 MG PO TABS: 2.0000 | ORAL_TABLET | Freq: Two times a day (BID) | ORAL | Status: AC

## 2015-05-17 NOTE — Discharge Instructions (Signed)
Anal Fissure, Adult An anal fissure is a small tear or crack in the skin around the anus. Bleeding from a fissure usually stops on its own within a few minutes. However, bleeding will often reoccur with each bowel movement until the crack heals.  CAUSES   Passing large, hard stools.  Frequent diarrheal stools.  Constipation.  Inflammatory bowel disease (Crohn's disease or ulcerative colitis).  Infections.  Anal sex. SYMPTOMS   Small amounts of blood seen on your stools, on toilet paper, or in the toilet after a bowel movement.  Rectal bleeding.  Painful bowel movements.  Itching or irritation around the anus. DIAGNOSIS Your caregiver will examine the anal area. An anal fissure can usually be seen with careful inspection. A rectal exam may be performed and a short tube (anoscope) may be used to examine the anal canal. TREATMENT   You may be instructed to take fiber supplements. These supplements can soften your stool to help make bowel movements easier.  Sitz baths may be recommended to help heal the tear. Do not use soap in the sitz baths.  A medicated cream or ointment may be prescribed to lessen discomfort. HOME CARE INSTRUCTIONS   Maintain a diet high in fruits, whole grains, and vegetables. Avoid constipating foods like bananas and dairy products.  Take sitz baths as directed by your caregiver.  Drink enough fluids to keep your urine clear or pale yellow.  Only take over-the-counter or prescription medicines for pain, discomfort, or fever as directed by your caregiver. Do not take aspirin as this may increase bleeding.  Do not use ointments containing numbing medications (anesthetics) or hydrocortisone. They could slow healing. SEEK MEDICAL CARE IF:   Your fissure is not completely healed within 3 days.  You have further bleeding.  You have a fever.  You have diarrhea mixed with blood.  You have pain.  Your problem is getting worse rather than  better. MAKE SURE YOU:   Understand these instructions.  Will watch your condition.  Will get help right away if you are not doing well or get worse. Document Released: 11/08/2005 Document Revised: 01/31/2012 Document Reviewed: 04/25/2011 [facility] Patient Information 2015 [place], [place]. This information is not intended to replace advice given to you by your health care provider. Make sure you discuss any questions you have with your health care provider.  Constipation Constipation is when a person has fewer than three bowel movements a week, has difficulty having a bowel movement, or has stools that are dry, hard, or larger than normal. As people grow older, constipation is more common. If you try to fix constipation with medicines that make you have a bowel movement (laxatives), the problem may get worse. Long-term laxative use may cause the muscles of the colon to become weak. A low-fiber diet, not taking in enough fluids, and taking certain medicines may make constipation worse.  CAUSES   Certain medicines, such as antidepressants, pain medicine, iron supplements, antacids, and water pills.   Certain diseases, such as diabetes, irritable bowel syndrome (IBS), thyroid disease, or depression.   Not drinking enough water.   Not eating enough fiber-rich foods.   Stress or travel.   Lack of physical activity or exercise.   Ignoring the urge to have a bowel movement.   Using laxatives too much.  SIGNS AND SYMPTOMS   Having fewer than three bowel movements a week.   Straining to have a bowel movement.   Having stools that are hard, dry, or larger than normal.  Feeling full or bloated.   Pain in the lower abdomen.   Not feeling relief after having a bowel movement.  DIAGNOSIS  Your health care provider will take a medical history and perform a physical exam. Further testing may be done for severe constipation. Some tests may include:  A barium enema X-ray to  examine your rectum, colon, and, sometimes, your small intestine.   A sigmoidoscopy to examine your lower colon.   A colonoscopy to examine your entire colon. TREATMENT  Treatment will depend on the severity of your constipation and what is causing it. Some dietary treatments include drinking more fluids and eating more fiber-rich foods. Lifestyle treatments may include regular exercise. If these diet and lifestyle recommendations do not help, your health care provider may recommend taking over-the-counter laxative medicines to help you have bowel movements. Prescription medicines may be prescribed if over-the-counter medicines do not work.  HOME CARE INSTRUCTIONS   Eat foods that have a lot of fiber, such as fruits, vegetables, whole grains, and beans.  Limit foods high in fat and processed sugars, such as french fries, hamburgers, cookies, candies, and soda.   A fiber supplement may be added to your diet if you cannot get enough fiber from foods.   Drink enough fluids to keep your urine clear or pale yellow.   Exercise regularly or as directed by your health care provider.   Go to the restroom when you have the urge to go. Do not hold it.   Only take over-the-counter or prescription medicines as directed by your health care provider. Do not take other medicines for constipation without talking to your health care provider first.  [place] IF:   You have bright red blood in your stool.   Your constipation lasts for more than 4 days or gets worse.   You have abdominal or rectal pain.   You have thin, pencil-like stools.   You have unexplained weight loss. MAKE SURE YOU:   Understand these instructions.  Will watch your condition.  Will get help right away if you are not doing well or get worse. Document Released: 08/06/2004 Document Revised: 11/13/2013 Document Reviewed: 08/20/2013 [facility] Patient Information 2015 [place], [place]. This  information is not intended to replace advice given to you by your health care provider. Make sure you discuss any questions you have with your health care provider.

## 2015-05-17 NOTE — ED Notes (Signed)
Pt verbalizes understanding of discharge instructions.

## 2015-05-17 NOTE — ED Provider Notes (Signed)
[facility] Emergency Department Provider Note  ____________________________________________  Time seen: 8:00 AM  I have reviewed the triage vital signs and the nursing notes.   HISTORY  Chief Complaint Constipation    HPI Victor Dillon is a 81 y.o. male who complains of constipation for 10 days. He had of movement this morning that was small but very hard stool. It was painful to pass. He denies any abdominal pain nausea vomiting. No fever chills chest pain shortness of breath. He has been eating and drinking normally, although he has decreased appetite recently because his abdomen feels full. No recent trauma. He is taking Colace for constipation and has had problems with this in the past.     Past Medical History  Diagnosis Date  . Hypertension     There are no active problems to display for this patient.   Past Surgical History  Procedure Laterality Date  . Appendectomy      Current Outpatient Rx  Name  Route  Sig  Dispense  Refill  . docusate sodium (COLACE) 100 MG capsule   Oral   Take 2 capsules (200 mg total) by mouth 2 (two) times daily.   120 capsule   0   . polyethylene glycol powder (GLYCOLAX/MIRALAX) powder      2 cap fulls in a full glass of water, three times a day, for 5 days.   255 g   0   . senna (SENOKOT) 8.6 MG TABS tablet   Oral   Take 2 tablets (17.2 mg total) by mouth 2 (two) times daily.   120 each   0     Allergies Review of patient's allergies indicates no known allergies.  History reviewed. No pertinent family history.  Social History History  Substance Use Topics  . Smoking status: Former Research scientist (life sciences)  . Smokeless tobacco: Not on file  . Alcohol Use: No    Review of Systems  Constitutional: No fever or chills. No weight changes Eyes:No blurry vision or double vision.  ENT: No sore throat. Cardiovascular: No chest pain. Respiratory: No dyspnea or cough. Gastrointestinal: Negative for abdominal  pain, vomiting and diarrhea.  No BRBPR or melena. Genitourinary: Negative for dysuria, urinary retention, bloody urine, or difficulty urinating. Musculoskeletal: Negative for back pain. No joint swelling or pain. Skin: Negative for rash. Neurological: Negative for headaches, focal weakness or numbness. Psychiatric:No anxiety or depression.   Endocrine:No hot/cold intolerance, changes in energy, or sleep difficulty.  10-point ROS otherwise negative.  ____________________________________________   PHYSICAL EXAM:  VITAL SIGNS: ED Triage Vitals  Enc Vitals Group     BP 05/17/15 0723 176/75 mmHg     Pulse Rate 05/17/15 0723 66     Resp 05/17/15 0723 18     Temp 05/17/15 0723 98.1 F (36.7 C)     Temp Source 05/17/15 0723 Oral     SpO2 05/17/15 0723 100 %     Weight 05/17/15 0723 145 lb (65.772 kg)     Height 05/17/15 0723 5\' 8"  (1.727 m)     Head Cir --      Peak Flow --      Pain Score 05/17/15 0724 0     Pain Loc --      Pain Edu? --      Excl. in [place]? --      Constitutional: Alert and oriented. Well appearing and in no distress. Eyes: No scleral icterus. No conjunctival pallor. PERRL. EOMI ENT   Head: Normocephalic and  atraumatic.   Nose: No congestion/rhinnorhea. No septal hematoma   Mouth/Throat: MMM, no pharyngeal erythema. No peritonsillar mass. No uvula shift.   Neck: No stridor. No SubQ emphysema. No meningismus. Hematological/Lymphatic/Immunilogical: No cervical lymphadenopathy. Cardiovascular: RRR. Normal and symmetric distal pulses are present in all extremities. No murmurs, rubs, or gallops. Respiratory: Normal respiratory effort without tachypnea nor retractions. Breath sounds are clear and equal bilaterally. No wheezes/rales/rhonchi. Gastrointestinal: Soft and nontender. No distention. There is no CVA tenderness.  No rebound, rigidity, or guarding. Rectal exam reveals brown stool. There is no hard or impacted stool in the rectal vault. Prostate is  unremarkable. Genitourinary: deferred Musculoskeletal: Nontender with normal range of motion in all extremities. No joint effusions.  No lower extremity tenderness.  No edema. Neurologic:   Normal speech and language.  CN 2-10 normal. Motor grossly intact. No pronator drift.  Normal gait. No gross focal neurologic deficits are appreciated.  Skin:  Skin is warm, dry and intact. No rash noted.  No petechiae, purpura, or bullae. Psychiatric: Mood and affect are normal. Speech and behavior are normal. Patient exhibits appropriate insight and judgment.  ____________________________________________    LABS (pertinent positives/negatives) (all labs ordered are listed, but only abnormal results are displayed) Labs Reviewed - No data to display ____________________________________________   EKG    ____________________________________________    RADIOLOGY  X-ray abdomen interpreted by me, and radiology report reviewed. X-ray demonstrates a large amount of bowel gas with stool balls noted in the intestine. There is a significant amount of stool visible on x-ray. No evidence of obstruction, no air-fluid levels, no free air.  ____________________________________________   PROCEDURES  ____________________________________________   INITIAL IMPRESSION / ASSESSMENT AND PLAN / ED COURSE  Pertinent labs & imaging results that were available during my care of the patient were reviewed by me and considered in my medical decision making (see chart for details).  Patient presents with constipation. No evidence of obstruction or volvulus. Low suspicion for GI bleed or surgical pathology. X-ray consistent with constipation. We'll discharge the patient home with senna Colace and MiraLAX. Follow up with primary care at the [facility].  ____________________________________________   FINAL CLINICAL IMPRESSION(S) / ED DIAGNOSES  Final diagnoses:  Constipation, unspecified constipation type       Carrie Mew, MD 05/17/15 [phone]

## 2015-05-17 NOTE — ED Notes (Signed)
Pt reports constipation for the past 10 days, able to pass some stool this morning, denies any abdominal pain or any other medical complaint. Pt talks in complete sentences no respiratory distress noted.

## 2015-07-12 ENCOUNTER — Encounter: Payer: Self-pay | Admitting: Emergency Medicine

## 2015-07-12 ENCOUNTER — Emergency Department
Admission: EM | Admit: 2015-07-12 | Discharge: 2015-07-12 | Disposition: A | Discharge status: Home / Self Care | Payer: Medicare Other | Attending: Emergency Medicine | Admitting: Emergency Medicine

## 2015-07-12 DIAGNOSIS — Z79899 Other long term (current) drug therapy: Secondary | ICD-10-CM | POA: Diagnosis not present

## 2015-07-12 DIAGNOSIS — Z9049 Acquired absence of other specified parts of digestive tract: Secondary | ICD-10-CM | POA: Insufficient documentation

## 2015-07-12 DIAGNOSIS — K5901 Slow transit constipation: Secondary | ICD-10-CM | POA: Insufficient documentation

## 2015-07-12 DIAGNOSIS — I10 Essential (primary) hypertension: Secondary | ICD-10-CM | POA: Insufficient documentation

## 2015-07-12 DIAGNOSIS — Z87891 Personal history of nicotine dependence: Secondary | ICD-10-CM | POA: Insufficient documentation

## 2015-07-12 DIAGNOSIS — R109 Unspecified abdominal pain: Secondary | ICD-10-CM | POA: Diagnosis present

## 2015-07-12 NOTE — ED Provider Notes (Signed)
[facility] Emergency Department Provider Note  ____________________________________________  Time seen: 10 AM  I have reviewed the triage vital signs and the nursing notes.   HISTORY  Chief Complaint Constipation     HPI Victor Dillon is a 81 y.o. male  who complains of not having had a bowel movement since Tuesday. He reports that he sits on the toilet and has to push and strain. He has had a ball or 2 of stool come out. He is beginning to have some abdominal discomfort. He denies any nausea. He does take a stool softener and other medications for this, but he is not sure how much she has taken and avoids taking too much due to his concerns for excess medication. He appears to be reserved and conservative about the amount that he may be taking.    Past Medical History  Diagnosis Date  . Hypertension     There are no active problems to display for this patient.   Past Surgical History  Procedure Laterality Date  . Appendectomy      Current Outpatient Rx  Name  Route  Sig  Dispense  Refill  . docusate sodium (COLACE) 100 MG capsule   Oral   Take 2 capsules (200 mg total) by mouth 2 (two) times daily.   120 capsule   0   . polyethylene glycol powder (GLYCOLAX/MIRALAX) powder      2 cap fulls in a full glass of water, three times a day, for 5 days.   255 g   0   . senna (SENOKOT) 8.6 MG TABS tablet   Oral   Take 2 tablets (17.2 mg total) by mouth 2 (two) times daily.   120 each   0     Allergies Review of patient's allergies indicates no known allergies.  History reviewed. No pertinent family history.  Social History Social History  Substance Use Topics  . Smoking status: Former Research scientist (life sciences)  . Smokeless tobacco: None  . Alcohol Use: No    Review of Systems  Constitutional: Negative for fever. ENT: Negative for sore throat. Cardiovascular: Negative for chest pain. Respiratory: Negative for shortness of  breath. Gastrointestinal: Positive for constipation with some abdominal pain.. Genitourinary: Negative for dysuria. Musculoskeletal: No myalgias or injuries. Skin: Negative for rash. Neurological: Negative for headaches   10-point ROS otherwise negative.  ____________________________________________   PHYSICAL EXAM:  VITAL SIGNS: ED Triage Vitals  Enc Vitals Group     BP 07/12/15 0908 182/74 mmHg     Pulse Rate 07/12/15 0908 71     Resp 07/12/15 0908 16     Temp 07/12/15 0908 97.9 F (36.6 C)     Temp Source 07/12/15 0908 Oral     SpO2 07/12/15 0907 98 %     Weight 07/12/15 0908 152 lb 12.8 oz (69.31 kg)     Height 07/12/15 0908 5\' 8"  (1.727 m)     Head Cir --      Peak Flow --      Pain Score 07/12/15 0909 8     Pain Loc --      Pain Edu? --      Excl. in [place]? --     Constitutional:  Alert and oriented. Well appearing and in no distress. ENT   Head: Normocephalic and atraumatic.   Nose: No congestion/rhinnorhea. Cardiovascular: Normal rate, regular rhythm, no murmur noted Respiratory:  Normal respiratory effort, no tachypnea.    Breath sounds are clear and equal  bilaterally.  Gastrointestinal: Soft and nontender. No distention. Rectal: Significant hard stool noted in the rectum. Stool is of a light brown color.  Musculoskeletal: No deformity noted. Nontender with normal range of motion in all extremities.  No noted edema. The patient is ambulatory but with assistance only. Neurologic:  Normal speech and language. Notable weakness of his legs when he is attempting to ambulate. He is able to stand and shuffle and take some steps on his own. Skin:  Skin is warm, dry. No rash noted. Psychiatric: Mood and affect are normal. Speech and behavior are normal.  ____________________________________________    ____________________________________________   INITIAL IMPRESSION / ASSESSMENT AND PLAN / ED COURSE  Pertinent labs & imaging results that were available  during my care of the patient were reviewed by me and considered in my medical decision making (see chart for details).  After rectal exam noting large amount of stool in the rectum and a small amount removed on digital exam, we place the patient on the toilet to attempt to evacuate his bowels. He was not able to do so. We will treat him with an enema at this time.  ----------------------------------------- 1:35 PM on 07/12/2015 -----------------------------------------  The patient has had a large bowel movement status post enema. He feels much better. Now on reexam, he has a benign abdomen. He feels ready to be discharged. His caretakers in the emergency department with him. She has reviewed his stool softener regimen with me. He usually takes MiraLAX every other day. I have asked her to continue with his regular regimen but increase the MiraLAX to once a day every day for 5 days, then resume every other day.   ____________________________________________   FINAL CLINICAL IMPRESSION(S) / ED DIAGNOSES  Final diagnoses:  Slow transit constipation      Ahmed Prima, MD 07/12/15 [phone]

## 2015-07-12 NOTE — Discharge Instructions (Signed)
Continue to take your usual stool softeners but increase the MiraLAX to once a day every day for 5 days, then he can return to MiraLAX every other day. Follow-up with your regular doctor. Return to the emergency department if you have abdominal pain or other urgent concerns.  Constipation Constipation is when a person has fewer than three bowel movements a week, has difficulty having a bowel movement, or has stools that are dry, hard, or larger than normal. As people grow older, constipation is more common. If you try to fix constipation with medicines that make you have a bowel movement (laxatives), the problem may get worse. Long-term laxative use may cause the muscles of the colon to become weak. A low-fiber diet, not taking in enough fluids, and taking certain medicines may make constipation worse.  CAUSES   Certain medicines, such as antidepressants, pain medicine, iron supplements, antacids, and water pills.   Certain diseases, such as diabetes, irritable bowel syndrome (IBS), thyroid disease, or depression.   Not drinking enough water.   Not eating enough fiber-rich foods.   Stress or travel.   Lack of physical activity or exercise.   Ignoring the urge to have a bowel movement.   Using laxatives too much.  SIGNS AND SYMPTOMS   Having fewer than three bowel movements a week.   Straining to have a bowel movement.   Having stools that are hard, dry, or larger than normal.   Feeling full or bloated.   Pain in the lower abdomen.   Not feeling relief after having a bowel movement.  DIAGNOSIS  Your health care provider will take a medical history and perform a physical exam. Further testing may be done for severe constipation. Some tests may include:  A barium enema X-ray to examine your rectum, colon, and, sometimes, your small intestine.   A sigmoidoscopy to examine your lower colon.   A colonoscopy to examine your entire colon. TREATMENT  Treatment will  depend on the severity of your constipation and what is causing it. Some dietary treatments include drinking more fluids and eating more fiber-rich foods. Lifestyle treatments may include regular exercise. If these diet and lifestyle recommendations do not help, your health care provider may recommend taking over-the-counter laxative medicines to help you have bowel movements. Prescription medicines may be prescribed if over-the-counter medicines do not work.  HOME CARE INSTRUCTIONS   Eat foods that have a lot of fiber, such as fruits, vegetables, whole grains, and beans.  Limit foods high in fat and processed sugars, such as french fries, hamburgers, cookies, candies, and soda.   A fiber supplement may be added to your diet if you cannot get enough fiber from foods.   Drink enough fluids to keep your urine clear or pale yellow.   Exercise regularly or as directed by your health care provider.   Go to the restroom when you have the urge to go. Do not hold it.   Only take over-the-counter or prescription medicines as directed by your health care provider. Do not take other medicines for constipation without talking to your health care provider first.  Stout IF:   You have bright red blood in your stool.   Your constipation lasts for more than 4 days or gets worse.   You have abdominal or rectal pain.   You have thin, pencil-like stools.   You have unexplained weight loss. MAKE SURE YOU:   Understand these instructions.  Will watch your condition.  Will get  help right away if you are not doing well or get worse. Document Released: 08/06/2004 Document Revised: 11/13/2013 Document Reviewed: 08/20/2013 [facility] Patient Information 2015 [place], [place]. This information is not intended to replace advice given to you by your health care provider. Make sure you discuss any questions you have with your health care provider.

## 2015-07-12 NOTE — ED Notes (Signed)
Pt discharged home after verbalizing understanding of discharge instructions; nad noted. 

## 2015-07-12 NOTE — ED Notes (Signed)
Pt assisted back to bed. Pt did have large BM.

## 2015-07-12 NOTE — ED Notes (Signed)
Pt states he has not had a BM since Tuesday and is complaining of abdominal pain. Pt is from [facility] assisted living.

## 2015-07-17 ENCOUNTER — Observation Stay
Admission: EM | Admit: 2015-07-17 | Discharge: 2015-07-18 | Disposition: A | Discharge status: Home / Self Care | Payer: Medicare Other | Attending: Internal Medicine | Admitting: Internal Medicine

## 2015-07-17 DIAGNOSIS — N400 Enlarged prostate without lower urinary tract symptoms: Secondary | ICD-10-CM | POA: Diagnosis not present

## 2015-07-17 DIAGNOSIS — R778 Other specified abnormalities of plasma proteins: Secondary | ICD-10-CM

## 2015-07-17 DIAGNOSIS — Z87891 Personal history of nicotine dependence: Secondary | ICD-10-CM | POA: Insufficient documentation

## 2015-07-17 DIAGNOSIS — I10 Essential (primary) hypertension: Secondary | ICD-10-CM | POA: Diagnosis not present

## 2015-07-17 DIAGNOSIS — M1990 Unspecified osteoarthritis, unspecified site: Secondary | ICD-10-CM | POA: Diagnosis not present

## 2015-07-17 DIAGNOSIS — R7989 Other specified abnormal findings of blood chemistry: Secondary | ICD-10-CM | POA: Diagnosis present

## 2015-07-17 DIAGNOSIS — Z79899 Other long term (current) drug therapy: Secondary | ICD-10-CM | POA: Insufficient documentation

## 2015-07-17 DIAGNOSIS — R748 Abnormal levels of other serum enzymes: Secondary | ICD-10-CM | POA: Insufficient documentation

## 2015-07-17 DIAGNOSIS — I200 Unstable angina: Secondary | ICD-10-CM | POA: Insufficient documentation

## 2015-07-17 DIAGNOSIS — R079 Chest pain, unspecified: Secondary | ICD-10-CM | POA: Diagnosis present

## 2015-07-17 DIAGNOSIS — Z7982 Long term (current) use of aspirin: Secondary | ICD-10-CM | POA: Insufficient documentation

## 2015-07-17 LAB — BASIC METABOLIC PANEL
ANION GAP: 9 (ref 5–15)
BUN: 40 mg/dL — ABNORMAL HIGH (ref 6–20)
CHLORIDE: 107 mmol/L (ref 101–111)
CO2: 23 mmol/L (ref 22–32)
Calcium: 10.1 mg/dL (ref 8.9–10.3)
Creatinine, Ser: 1.75 mg/dL — ABNORMAL HIGH (ref 0.61–1.24)
GFR calc Af Amer: 36 mL/min — ABNORMAL LOW (ref >60)
GFR, EST NON AFRICAN AMERICAN: 31 mL/min — AB (ref >60)
Glucose, Bld: 114 mg/dL — ABNORMAL HIGH (ref 65–99)
Potassium: 4.7 mmol/L (ref 3.5–5.1)
Sodium: 139 mmol/L (ref 135–145)

## 2015-07-17 LAB — CBC
HCT: 35.8 % — ABNORMAL LOW (ref 40.0–52.0)
Hemoglobin: 11.6 g/dL — ABNORMAL LOW (ref 13.0–18.0)
MCH: 30.7 pg (ref 26.0–34.0)
MCHC: 32.4 g/dL (ref 32.0–36.0)
MCV: 94.7 fL (ref 80.0–100.0)
PLATELETS: 199 10*3/uL (ref 150–440)
RBC: 3.78 MIL/uL — AB (ref 4.40–5.90)
RDW: 18.4 % — ABNORMAL HIGH (ref 11.5–14.5)
WBC: 8.6 10*3/uL (ref 3.8–10.6)

## 2015-07-17 LAB — CREATININE, SERUM
CREATININE: 1.72 mg/dL — AB (ref 0.61–1.24)
GFR, EST AFRICAN AMERICAN: 37 mL/min — AB (ref >60)
GFR, EST NON AFRICAN AMERICAN: 32 mL/min — AB (ref >60)

## 2015-07-17 LAB — TROPONIN I
TROPONIN I: 0.05 ng/mL — AB (ref <0.031)
Troponin I: <0.03 ng/mL (ref <0.031)
Troponin I: <0.03 ng/mL (ref <0.031)

## 2015-07-17 MED ORDER — SENNOSIDES-DOCUSATE SODIUM 8.6-50 MG PO TABS: 1.0000 | ORAL_TABLET | Freq: Every evening | ORAL | Status: DC | PRN

## 2015-07-17 MED ORDER — ASPIRIN EC 81 MG PO TBEC
81.0000 mg | DELAYED_RELEASE_TABLET | Freq: Every day | ORAL | Status: DC
  Administered 2015-07-18: 81 mg via ORAL
  Filled 2015-07-17: qty 1

## 2015-07-17 MED ORDER — DOCUSATE SODIUM 100 MG PO CAPS
200.0000 mg | ORAL_CAPSULE | Freq: Two times a day (BID) | ORAL | Status: DC
  Administered 2015-07-17 – 2015-07-18 (×2): 200 mg via ORAL
  Filled 2015-07-17 (×2): qty 2

## 2015-07-17 MED ORDER — ENOXAPARIN SODIUM 40 MG/0.4ML ~~LOC~~ SOLN: 40.0000 mg | SUBCUTANEOUS | Status: DC

## 2015-07-17 MED ORDER — ACETAMINOPHEN 325 MG PO TABS: 650.0000 mg | ORAL_TABLET | ORAL | Status: DC | PRN

## 2015-07-17 MED ORDER — SENNA 8.6 MG PO TABS
2.0000 | ORAL_TABLET | Freq: Two times a day (BID) | ORAL | Status: DC
  Administered 2015-07-17 – 2015-07-18 (×2): 17.2 mg via ORAL
  Filled 2015-07-17 (×2): qty 2

## 2015-07-17 MED ORDER — FINASTERIDE 1 MG PO TABS: 2.0000 mg | ORAL_TABLET | Freq: Every day | ORAL | Status: DC

## 2015-07-17 MED ORDER — ONDANSETRON HCL 4 MG/2ML IJ SOLN: 4.0000 mg | Freq: Four times a day (QID) | INTRAMUSCULAR | Status: DC | PRN

## 2015-07-17 MED ORDER — TERAZOSIN HCL 5 MG PO CAPS
5.0000 mg | ORAL_CAPSULE | Freq: Every day | ORAL | Status: DC
  Administered 2015-07-17: 5 mg via ORAL
  Filled 2015-07-17 (×3): qty 1

## 2015-07-17 MED ORDER — CALCIUM CARBONATE ANTACID 500 MG PO CHEW
1.0000 | CHEWABLE_TABLET | Freq: Two times a day (BID) | ORAL | Status: DC
  Administered 2015-07-17 – 2015-07-18 (×2): 200 mg via ORAL
  Filled 2015-07-17 (×2): qty 1

## 2015-07-17 MED ORDER — ENOXAPARIN SODIUM 30 MG/0.3ML ~~LOC~~ SOLN: 30.0000 mg | SUBCUTANEOUS | Status: DC

## 2015-07-17 MED ORDER — HYDROCODONE-ACETAMINOPHEN 5-325 MG PO TABS: 1.0000 | ORAL_TABLET | ORAL | Status: DC | PRN

## 2015-07-17 MED ORDER — ASPIRIN EC 81 MG PO TBEC: 81.0000 mg | DELAYED_RELEASE_TABLET | Freq: Every day | ORAL | Status: DC

## 2015-07-17 MED ORDER — METOPROLOL TARTRATE 25 MG PO TABS
25.0000 mg | ORAL_TABLET | Freq: Two times a day (BID) | ORAL | Status: DC
  Administered 2015-07-17 – 2015-07-18 (×2): 25 mg via ORAL
  Filled 2015-07-17 (×2): qty 1

## 2015-07-17 MED ORDER — CALCIUM CARBONATE 600 MG PO TABS: 600.0000 mg | ORAL_TABLET | Freq: Two times a day (BID) | ORAL | Status: DC

## 2015-07-17 MED ORDER — NITROGLYCERIN 0.4 MG SL SUBL: 0.4000 mg | SUBLINGUAL_TABLET | SUBLINGUAL | Status: DC | PRN

## 2015-07-17 MED ORDER — ASPIRIN 81 MG PO CHEW
324.0000 mg | CHEWABLE_TABLET | Freq: Once | ORAL | Status: AC
  Administered 2015-07-17: 324 mg via ORAL
  Filled 2015-07-17: qty 4

## 2015-07-17 MED ORDER — AMLODIPINE BESYLATE 10 MG PO TABS
10.0000 mg | ORAL_TABLET | Freq: Every day | ORAL | Status: DC
  Administered 2015-07-18: 10 mg via ORAL
  Filled 2015-07-17: qty 1

## 2015-07-17 MED ORDER — ONDANSETRON HCL 4 MG PO TABS: 4.0000 mg | ORAL_TABLET | Freq: Four times a day (QID) | ORAL | Status: DC | PRN

## 2015-07-17 MED ORDER — HYDRALAZINE HCL 50 MG PO TABS
50.0000 mg | ORAL_TABLET | Freq: Three times a day (TID) | ORAL | Status: DC
  Administered 2015-07-17 – 2015-07-18 (×2): 50 mg via ORAL
  Filled 2015-07-17 (×2): qty 1

## 2015-07-17 MED ORDER — LISINOPRIL 5 MG PO TABS
5.0000 mg | ORAL_TABLET | Freq: Every day | ORAL | Status: DC
  Administered 2015-07-18: 5 mg via ORAL
  Filled 2015-07-17: qty 1

## 2015-07-17 NOTE — ED Notes (Signed)
Patient to be admitted for observation overnight. Have contacted his care provider to let her know.

## 2015-07-17 NOTE — H&P (Signed)
[facility] [place] NAME: Victor Dillon    MR#:  [id number]  DATE OF BIRTH:  [birth year]  DATE OF ADMISSION:  07/17/2015  PRIMARY CARE PHYSICIAN: No PCP Per Patient PCPs from  [facility].  REQUESTING/REFERRING PHYSICIAN:   CHIEF COMPLAINT: Chest pain    Chief Complaint  Patient presents with  . Chest Pain    HISTORY OF PRESENT ILLNESS:  Victor Dillon  is a 81 y.o. male with a known history of hypertension, osteoarthritis brought in from [place] secondary to chest pain. Noticed chest pain in the middle of the chest. No radiation to the jaw, no radiation of the arms. No aggravating factors. No relieving factors. The chest pain was around 7 out of 10 in severe day when it happened. No cough, no shortness of breath, no exertional dyspnea. Because of rising second troponin. I asked to admit the patient. Denies chest pain at this time. Follows up with Dr. Clayborn Dillon  regularly, seen him in April.  PAST MEDICAL HISTORY:   Past Medical History  Diagnosis Date  . Hypertension     PAST SURGICAL HISTOIRY:   Past Surgical History  Procedure Laterality Date  . Appendectomy      SOCIAL HISTORY:   Social History  Substance Use Topics  . Smoking status: Former Research scientist (life sciences)  . Smokeless tobacco: Not on file  . Alcohol Use: No    FAMILY HISTORY:  No family history on file.  DRUG ALLERGIES:  No Known Allergies  REVIEW OF SYSTEMS:  CONSTITUTIONAL: No fever, fatigue or weakness.  EYES: No blurred or double vision.  EARS, NOSE, AND THROAT: No tinnitus or ear pain.  RESPIRATORY: No cough, shortness of breath, wheezing or hemoptysis.  CARDIOVASCULAR: No chest pain, orthopnea, edema.  GASTROINTESTINAL: No nausea, vomiting, diarrhea or abdominal pain.  GENITOURINARY: No dysuria, hematuria.  ENDOCRINE: No polyuria, nocturia,  HEMATOLOGY: No anemia, easy bruising or bleeding SKIN: No rash or lesion. MUSCULOSKELETAL: No joint pain or  arthritis.   NEUROLOGIC: No tingling, numbness, weakness.  PSYCHIATRY: No anxiety or depression.   MEDICATIONS AT HOME:   Prior to Admission medications   Medication Sig Start Date End Date Taking? Authorizing Provider  acetaminophen (TYLENOL) 325 MG tablet Take 650 mg by mouth every 4 (four) hours as needed.   Yes Historical Provider, MD  amLODipine (NORVASC) 10 MG tablet Take 10 mg by mouth daily.   Yes Historical Provider, MD  aspirin EC 81 MG tablet Take 81 mg by mouth daily.   Yes Historical Provider, MD  calcium carbonate (CALCIUM 600) 600 MG TABS tablet Take 600 mg by mouth 2 (two) times daily.   Yes Historical Provider, MD  docusate sodium (COLACE) 100 MG capsule Take 2 capsules (200 mg total) by mouth 2 (two) times daily. 05/17/15  Yes Carrie Mew, MD  finasteride (PROPECIA) 1 MG tablet Take 2 mg by mouth daily.   Yes Historical Provider, MD  hydrALAZINE (APRESOLINE) 50 MG tablet Take 50 mg by mouth 3 (three) times daily.   Yes Historical Provider, MD  HYDROcodone-acetaminophen (NORCO/VICODIN) 5-325 MG per tablet Take 1 tablet by mouth every 6 (six) hours as needed.   Yes Historical Provider, MD  lisinopril (PRINIVIL,ZESTRIL) 20 MG tablet Take 20 mg by mouth daily.   Yes Historical Provider, MD  senna (SENOKOT) 8.6 MG TABS tablet Take 2 tablets (17.2 mg total) by mouth 2 (two) times daily. 05/17/15  Yes Carrie Mew, MD  terazosin (HYTRIN) 5 MG  capsule Take 5 mg by mouth at bedtime.   Yes Historical Provider, MD      VITAL SIGNS:  Blood pressure 130/110, pulse 65, temperature 98.3 F (36.8 C), temperature source Oral, resp. rate 18, height 5\' 8"  (1.727 m), weight 70.308 kg (155 lb), SpO2 98 %.  PHYSICAL EXAMINATION:  GENERAL:  81 y.o.-year-old patient lying in the bed with no acute distress.  EYES: Pupils equal, round, reactive to light and accommodation. No scleral icterus. Extraocular muscles intact.  HEENT: Head atraumatic, normocephalic. Oropharynx and nasopharynx  clear.  NECK:  Supple, no jugular venous distention. No thyroid enlargement, no tenderness.  LUNGS: Normal breath sounds bilaterally, no wheezing, rales,rhonchi or crepitation. No use of accessory muscles of respiration.  CARDIOVASCULAR: S1, S2 normal. No murmurs, rubs, or gallops.  ABDOMEN: Soft, nontender, nondistended. Bowel sounds present. No organomegaly or mass.  EXTREMITIES: No pedal edema, cyanosis, or clubbing.  NEUROLOGIC: Cranial nerves II through XII are intact. Muscle strength 5/5 in all extremities. Sensation intact. Gait not checked.  PSYCHIATRIC: The patient is alert and oriented x 3.  SKIN: No obvious rash, lesion, or ulcer.   LABORATORY PANEL:   CBC No results for input(s): WBC, HGB, HCT, PLT in the last 168 hours. ------------------------------------------------------------------------------------------------------------------  Chemistries   Recent Labs Lab 07/17/15 1504  NA 139  K 4.7  CL 107  CO2 23  GLUCOSE 114*  BUN 40*  CREATININE 1.75*  CALCIUM 10.1   ------------------------------------------------------------------------------------------------------------------  Cardiac Enzymes  Recent Labs Lab 07/17/15 1545  TROPONINI 0.05*   ------------------------------------------------------------------------------------------------------------------  RADIOLOGY:  No results found.  EKG:   Orders placed or performed during the hospital encounter of 07/17/15  . ED EKG  . ED EKG   sinus rhythm with the PACs 79 bpm T wave inversion in V4 and V5 V6 present. these are new changes.  IMPRESSION AND PLAN:  #1 chest pain sounds atypical but because of EKG changes, slightly elevated troponin admit him to observation, continue aspirin, nitroglycerin, add small dose beta blockers. And obtain Lexiscan stress test tomorrow,  2.Uncontrolled hypertension and patient takes hydralazine, Norvasc, Prinivil. I have added a small dose beta blocker. And that decreases  the patient does because of slight worsening renal function. #3 CK D stage III. Monitor closely. And decreases the dose of ace inhibitors. #4 history of BPH continue Terazosin.  All the records are reviewed and case discussed with ED provider. Management plans discussed with the patient, family and they are in agreement.  CODE STATUS: full  TOTAL TIME TAKING CARE OF THIS PATIENT: 50 minutes.    Epifanio Lesches M.D on 07/17/2015 at 8:08 PM  Between 7am to 6pm - Pager - [phone]  After 6pm go to [email or web address] - password [email or web address] [facility]  [facility]  Office  [phone]  CC: Primary care physician; No PCP Per Patient

## 2015-07-17 NOTE — ED Notes (Signed)
Patient helped with urinal. Warm blanket given. Denies any complaints or concerns at this time.

## 2015-07-17 NOTE — ED Notes (Signed)
Patient began to have CP on the way to the barber shop this afternoon. EMS was called. Patient states pain is gone now.

## 2015-07-17 NOTE — ED Provider Notes (Signed)
[facility] Emergency Department Provider Note  ____________________________________________  Time seen: 2:30 PM on arrival by EMS  I have reviewed the triage vital signs and the nursing notes.   HISTORY  Chief Complaint Chest Pain    HPI Victor Dillon is a 81 y.o. male who was being transported from [place] to the barbershop today for a haircut when he began having some midsternal chest pain described as a heaviness at around noon. Upon arriving to the barber shop he said the pain was starting to feel better but they called EMS anyway. While waiting for EMS to arrive, the pain resolved. EMS did not give any aspirin or nitroglycerin. Pain remained resolved during transport and he arrives in the ED with no symptoms at all and feeling back to normal. He reports usual appetite and oral intake. No exertional or pleuritic pain or symptoms. Denies any shortness of breath nausea vomiting or diaphoresis. Pain is nonradiating, and was moderate in intensity when it was present. No aggravating or alleviating factors.     Past Medical History  Diagnosis Date  . Hypertension     There are no active problems to display for this patient.   Past Surgical History  Procedure Laterality Date  . Appendectomy      Current Outpatient Rx  Name  Route  Sig  Dispense  Refill  . docusate sodium (COLACE) 100 MG capsule   Oral   Take 2 capsules (200 mg total) by mouth 2 (two) times daily.   120 capsule   0   . polyethylene glycol powder (GLYCOLAX/MIRALAX) powder      2 cap fulls in a full glass of water, three times a day, for 5 days.   255 g   0   . senna (SENOKOT) 8.6 MG TABS tablet   Oral   Take 2 tablets (17.2 mg total) by mouth 2 (two) times daily.   120 each   0     Allergies Review of patient's allergies indicates no known allergies.  No family history on file.  Social History Social History  Substance Use Topics  . Smoking status: Former  Research scientist (life sciences)  . Smokeless tobacco: None  . Alcohol Use: No    Review of Systems  Constitutional: No fever or chills. No weight changes Eyes:No blurry vision or double vision.  ENT: No sore throat. Cardiovascular: Positive chest pain as above. Respiratory: No dyspnea or cough. Gastrointestinal: Negative for abdominal pain, vomiting and diarrhea.  No BRBPR or melena. Genitourinary: Negative for dysuria, urinary retention, bloody urine, or difficulty urinating. Musculoskeletal: Negative for back pain. No joint swelling or pain. Skin: Negative for rash. Neurological: Negative for headaches, focal weakness or numbness. Psychiatric:No anxiety or depression.   Endocrine:No hot/cold intolerance, changes in energy, or sleep difficulty.  10-point ROS otherwise negative.  ____________________________________________   PHYSICAL EXAM:  VITAL SIGNS: ED Triage Vitals  Enc Vitals Group     BP 07/17/15 1340 167/75 mmHg     Pulse Rate 07/17/15 1340 67     Resp 07/17/15 1340 14     Temp 07/17/15 1340 98.3 F (36.8 C)     Temp Source 07/17/15 1340 Oral     SpO2 07/17/15 1340 99 %     Weight 07/17/15 1340 155 lb (70.308 kg)     Height 07/17/15 1340 5\' 8"  (1.727 m)     Head Cir --      Peak Flow --      Pain Score 07/17/15  1341 0     Pain Loc --      Pain Edu? --      Excl. in [place]? --      Constitutional: Alert and oriented. Well appearing and in no distress. Eyes: No scleral icterus. No conjunctival pallor. PERRL. EOMI ENT   Head: Normocephalic and atraumatic.   Nose: No congestion/rhinnorhea. No septal hematoma   Mouth/Throat: MMM, no pharyngeal erythema. No peritonsillar mass. No uvula shift.   Neck: No stridor. No SubQ emphysema. No meningismus. Hematological/Lymphatic/Immunilogical: No cervical lymphadenopathy. Cardiovascular: RRR. Normal and symmetric distal pulses are present in all extremities. No murmurs, rubs, or gallops. Respiratory: Normal respiratory effort  without tachypnea nor retractions. Breath sounds are clear and equal bilaterally. No wheezes/rales/rhonchi. Gastrointestinal: Soft and nontender. No distention. There is no CVA tenderness.  No rebound, rigidity, or guarding. Genitourinary: deferred Musculoskeletal: Nontender with normal range of motion in all extremities. No joint effusions.  No lower extremity tenderness.  No edema. Neurologic:   Normal speech and language.  CN 2-10 normal. Motor grossly intact. No pronator drift.  Normal gait. No gross focal neurologic deficits are appreciated.  Skin:  Skin is warm, dry and intact. No rash noted.  No petechiae, purpura, or bullae. Psychiatric: Mood and affect are normal. Speech and behavior are normal. Patient exhibits appropriate insight and judgment.  ____________________________________________    LABS (pertinent positives/negatives) (all labs ordered are listed, but only abnormal results are displayed) Labs Reviewed  BASIC METABOLIC PANEL - Abnormal; Notable for the following:    Glucose, Bld 114 (*)    BUN 40 (*)    Creatinine, Ser 1.75 (*)    GFR calc non Af Amer 31 (*)    GFR calc Af Amer 36 (*)    All other components within normal limits  TROPONIN I - Abnormal; Notable for the following:    Troponin I 0.05 (*)    All other components within normal limits  TROPONIN I  CBC WITH DIFFERENTIAL/PLATELET   ____________________________________________   EKG  Interpreted by me Normal sinus rhythm rate of 79, normal axis and intervals. There are voltage criteria for LVH. Normal ST segments. T wave inversions in V5 and V6, which is unchanged from 08/29/2014  ____________________________________________    RADIOLOGY    ____________________________________________   PROCEDURES  ____________________________________________   INITIAL IMPRESSION / ASSESSMENT AND PLAN / ED COURSE  Pertinent labs & imaging results that were available during my care of the patient  were reviewed by me and considered in my medical decision making (see chart for details).  Patient presents with brief episode of atypical chest pain. Low suspicion for ACS PE TAD pneumothorax carditis mediastinitis pneumonia or sepsis. We will check 2 troponins and if negative discharge the patient home to follow up with primary care and cardiology. ----------------------------------------- 6:46 PM on 07/17/2015 -----------------------------------------  Second troponin was elevated to 0.05. Based on resolution of chest pain and for a mild elevation of the troponin, I have low suspicion for active ACS. However, due to age and comorbidities we will give aspirin and hospitalized for further evaluation. I discussed this with the hospitalist who will evaluate the patient in the ED. ____________________________________________   FINAL CLINICAL IMPRESSION(S) / ED DIAGNOSES  Final diagnoses:  Chest pain at rest  Elevated troponin      Carrie Mew, MD 07/17/15 [id number]

## 2015-07-18 ENCOUNTER — Observation Stay: Payer: Medicare Other

## 2015-07-18 LAB — BASIC METABOLIC PANEL
Anion gap: 6 (ref 5–15)
BUN: 33 mg/dL — AB (ref 6–20)
CALCIUM: 9.4 mg/dL (ref 8.9–10.3)
CHLORIDE: 112 mmol/L — AB (ref 101–111)
CO2: 25 mmol/L (ref 22–32)
CREATININE: 1.4 mg/dL — AB (ref 0.61–1.24)
GFR calc non Af Amer: 41 mL/min — ABNORMAL LOW (ref >60)
GFR, EST AFRICAN AMERICAN: 47 mL/min — AB (ref >60)
Glucose, Bld: 95 mg/dL (ref 65–99)
Potassium: 4.4 mmol/L (ref 3.5–5.1)
SODIUM: 143 mmol/L (ref 135–145)

## 2015-07-18 LAB — NM MYOCAR MULTI W/SPECT W/WALL MOTION / EF
CHL CUP RESTING HR STRESS: 69 {beats}/min
Peak HR: 116 {beats}/min

## 2015-07-18 LAB — CBC
HCT: 33.2 % — ABNORMAL LOW (ref 40.0–52.0)
Hemoglobin: 10.9 g/dL — ABNORMAL LOW (ref 13.0–18.0)
MCH: 30.7 pg (ref 26.0–34.0)
MCHC: 32.8 g/dL (ref 32.0–36.0)
MCV: 93.6 fL (ref 80.0–100.0)
PLATELETS: 210 10*3/uL (ref 150–440)
RBC: 3.55 MIL/uL — ABNORMAL LOW (ref 4.40–5.90)
RDW: 17.7 % — AB (ref 11.5–14.5)
WBC: 5.9 10*3/uL (ref 3.8–10.6)

## 2015-07-18 LAB — MRSA PCR SCREENING: MRSA by PCR: NEGATIVE

## 2015-07-18 MED ORDER — FINASTERIDE 5 MG PO TABS
2.5000 mg | ORAL_TABLET | Freq: Every day | ORAL | Status: DC
  Administered 2015-07-18: 2.5 mg via ORAL
  Filled 2015-07-18: qty 1

## 2015-07-18 MED ORDER — TECHNETIUM TC 99M SESTAMIBI - CARDIOLITE
30.0000 | Freq: Once | INTRAVENOUS | Status: AC | PRN
  Administered 2015-07-18: 12:00:00 32.251 via INTRAVENOUS

## 2015-07-18 MED ORDER — REGADENOSON 0.4 MG/5ML IV SOLN
0.4000 mg | Freq: Once | INTRAVENOUS | Status: AC
  Administered 2015-07-18: 0.4 mg via INTRAVENOUS
  Filled 2015-07-18: qty 5

## 2015-07-18 MED ORDER — TECHNETIUM TC 99M SESTAMIBI - CARDIOLITE
14.1700 | Freq: Once | INTRAVENOUS | Status: AC | PRN
  Administered 2015-07-18: 11:00:00 14.17 via INTRAVENOUS

## 2015-07-18 MED ORDER — METOPROLOL SUCCINATE ER 25 MG PO TB24: 25.0000 mg | ORAL_TABLET | Freq: Every day | ORAL | Status: AC

## 2015-07-18 NOTE — Progress Notes (Signed)
Per Dr. Darvin Neighbours, place order for patient to Discharge to Home.

## 2015-07-18 NOTE — Progress Notes (Signed)
Dr. Darvin Neighbours notified of stress test results and Dr. Alveria Apley note. MD to place discharge orders. Patient requested friend, Glendell Docker, be notified for transportation. No needs by Care Management/Social work as patient is from independent living.

## 2015-07-18 NOTE — Progress Notes (Signed)
Patient admitted to room 233, A & O. No acute distress noted. Denied any pain. Skin assessment done with Yasmin S RN. Noted some dry flaky feet and old scar on mid spine on the upper back. Patient able to make needs known.

## 2015-07-18 NOTE — Progress Notes (Signed)
Patient arrived back to unit at 1320. Denies pain and Dr. Darvin Neighbours visited the patient at the bedside. Earleen Reaper, RN

## 2015-07-18 NOTE — Discharge Instructions (Signed)

## 2015-07-18 NOTE — Progress Notes (Signed)
Patient discharged to independent living. Instructions given. Printed/Electronic prescriptions sent. IV and tele removed at discharge. Transportation provided by friend. No more needs from care management and/or social work. Patient stable, on room air. Earleen Reaper, RN

## 2015-07-18 NOTE — Progress Notes (Signed)
Spoke with Dr. Nehemiah Massed and [facility] regarding order for cardiac stress test. MD agreed to consult patient down at stress test. Earleen Reaper, RN

## 2015-07-18 NOTE — Care Management (Signed)
Patient admitted under observation for chest pain.  Troponins are negative . Stress results pending.  Patient is from [facility], uses a walker to ambulate and has an in home caregiver.  His meals are taken in his room instead of going down to the dining hall due to "bad knees."   No discharge needs identified at present time.

## 2015-07-18 NOTE — Consult Note (Signed)
[facility] Consultation Note  Patient ID: Victor Dillon, MRN: [id number], DOB/AGE: 1917-01-16 80 y.o. Admit date: 07/17/2015   Date of Consult: 07/18/2015 Primary Physician: No PCP Per Patient Primary Cardiologist: None  Chief Complaint:  Chief Complaint  Patient presents with  . Chest Pain   Reason for Consult: unstable angina  HPI: 81 y.o. male with hypertension and no evidence of significant previous cardiovascular history with an acute onset of substernal chest discomfort radiating into the back with shortness of breath over the last several days culminating in emergency room visit. The patient did have some EKG changes with nonspecific changes but no evidence of ST elevation myocardial infarction. Troponin level has peaked out at 0.05 and the patient has had resolution of chest discomfort. Since then the patient has had a stress test showing no evidence of EKG changes and additionally no evidence of myocardial ischemia by stress test. Therefore the patient is at low risk for this primarily being cardiovascular in nature.  Past Medical History  Diagnosis Date  . Hypertension       Surgical History:  Past Surgical History  Procedure Laterality Date  . Appendectomy       Home Meds: Prior to Admission medications   Medication Sig Start Date End Date Taking? Authorizing Provider  acetaminophen (TYLENOL) 325 MG tablet Take 650 mg by mouth every 4 (four) hours as needed.   Yes Historical Provider, MD  amLODipine (NORVASC) 10 MG tablet Take 10 mg by mouth daily.   Yes Historical Provider, MD  aspirin EC 81 MG tablet Take 81 mg by mouth daily.   Yes Historical Provider, MD  calcium carbonate (CALCIUM 600) 600 MG TABS tablet Take 600 mg by mouth 2 (two) times daily.   Yes Historical Provider, MD  docusate sodium (COLACE) 100 MG capsule Take 2 capsules (200 mg total) by mouth 2 (two) times daily. 05/17/15  Yes Carrie Mew, MD  finasteride (PROPECIA) 1 MG tablet Take  2 mg by mouth daily.   Yes Historical Provider, MD  hydrALAZINE (APRESOLINE) 50 MG tablet Take 50 mg by mouth 3 (three) times daily.   Yes Historical Provider, MD  HYDROcodone-acetaminophen (NORCO/VICODIN) 5-325 MG per tablet Take 1 tablet by mouth every 6 (six) hours as needed.   Yes Historical Provider, MD  lisinopril (PRINIVIL,ZESTRIL) 20 MG tablet Take 20 mg by mouth daily.   Yes Historical Provider, MD  senna (SENOKOT) 8.6 MG TABS tablet Take 2 tablets (17.2 mg total) by mouth 2 (two) times daily. 05/17/15  Yes Carrie Mew, MD  terazosin (HYTRIN) 5 MG capsule Take 5 mg by mouth at bedtime.   Yes Historical Provider, MD  metoprolol succinate (TOPROL XL) 25 MG 24 hr tablet Take 1 tablet (25 mg total) by mouth daily. 07/18/15   Hillary Bow, MD    Inpatient Medications:  . amLODipine  10 mg Oral Daily  . aspirin EC  81 mg Oral Daily  . calcium carbonate  1 tablet Oral BID  . docusate sodium  200 mg Oral BID  . enoxaparin (LOVENOX) injection  30 mg Subcutaneous Q24H  . finasteride  2.5 mg Oral Daily  . hydrALAZINE  50 mg Oral TID  . lisinopril  5 mg Oral Daily  . metoprolol tartrate  25 mg Oral BID  . senna  2 tablet Oral BID  . terazosin  5 mg Oral QHS      Allergies: No Known Allergies  Social History   Social History  . Marital  Status: Single    Spouse Name: N/A  . Number of Children: N/A  . Years of Education: N/A   Occupational History  . Not on file.   Social History Main Topics  . Smoking status: Former Research scientist (life sciences)  . Smokeless tobacco: Not on file  . Alcohol Use: No  . Drug Use: No  . Sexual Activity: Not on file   Other Topics Concern  . Not on file   Social History Narrative     No family history on file.   Review of Systems Positive for chest discomfort Negative for: General:  chills, fever, night sweats or weight changes.  Cardiovascular: PND orthopnea syncope dizziness  Dermatological skin lesions rashes Respiratory: Cough congestion Urologic:  Frequent urination urination at night and hematuria Abdominal: negative for nausea, vomiting, diarrhea, bright red blood per rectum, melena, or hematemesis Neurologic: negative for visual changes, and/or hearing changes  All other systems reviewed and are otherwise negative except as noted above.  Labs:  Recent Labs  07/17/15 1504 07/17/15 1545  TROPONINI <0.03 <0.03  0.05*   Lab Results  Component Value Date   WBC 5.9 07/18/2015   HGB 10.9* 07/18/2015   HCT 33.2* 07/18/2015   MCV 93.6 07/18/2015   PLT 210 07/18/2015    Recent Labs Lab 07/18/15 0510  NA 143  K 4.4  CL 112*  CO2 25  BUN 33*  CREATININE 1.40*  CALCIUM 9.4  GLUCOSE 95   Lab Results  Component Value Date   CHOL 152 10/27/2013   HDL 46 10/27/2013   LDLCALC 88 10/27/2013   TRIG 90 10/27/2013   No results found for: DDIMER  Radiology/Studies:  No results found.  EKG: Normal sinus rhythm with nonspecific ST changes  Weights: Filed Weights   07/17/15 1340 07/17/15 2156  Weight: 155 lb (70.308 kg) 140 lb 9.6 oz (63.776 kg)     Physical Exam: Blood pressure 139/62, pulse 62, temperature 98.2 F (36.8 C), temperature source Oral, resp. rate 18, height 5\' 8"  (1.727 m), weight 140 lb 9.6 oz (63.776 kg), SpO2 100 %. Body mass index is 21.38 kg/(m^2). General: Well developed, well nourished, in no acute distress. Head eyes ears nose throat: Normocephalic, atraumatic, sclera non-icteric, no xanthomas, nares are without discharge. No apparent thyromegaly and/or mass  Lungs: Normal respiratory effort.  no wheezes, no rales, no rhonchi.  Heart: RRR with normal S1 S2. no murmur gallop, no rub, PMI is normal size and placement, carotid upstroke normal without bruit, jugular venous pressure is normal Abdomen: Soft, non-tender, non-distended with normoactive bowel sounds. No hepatomegaly. No rebound/guarding. No obvious abdominal masses. Abdominal aorta is normal size without bruit Extremities: No edema. no  cyanosis, no clubbing, no ulcers  Peripheral : 2+ bilateral upper extremity pulses, 2+ bilateral femoral pulses, 2+ bilateral dorsal pedal pulse Neuro: Alert and oriented. No facial asymmetry. No focal deficit. Moves all extremities spontaneously. Musculoskeletal: Normal muscle tone without kyphosis     Assessment: 81 year old male with essential hypertension and chest discomfort of unknown etiology without evidence of myocardial infarction at this time and a normal stress test without evidence of myocardial ischemia  Plan: 1. Continue treatment of essential hypertension and possible use of aspirin low dose for any further risk reduction cardiovascular event 2. Possible treatment of noncardiac chest discomfort and/or use of isosorbide if this helps his chest discomfort 3. No further cardiac diagnostics necessary at this time 4. Okay for discharge to home from cardiac standpoint  Signed, Corey Skains M.D. [facility] 07/18/2015, 5:07 PM

## 2015-07-23 NOTE — Discharge Summary (Signed)
[facility] NAME: Victor Dillon    MR#:  [id number]  DATE OF BIRTH:  Nov 29, 1916  DATE OF ADMISSION:  07/17/2015 ADMITTING PHYSICIAN: Epifanio Lesches, MD  DATE OF DISCHARGE: 07/18/2015  6:28 PM  PRIMARY CARE PHYSICIAN: No PCP Per Patient    ADMISSION DIAGNOSIS:  Elevated troponin [R79.89] Chest pain at rest [R07.9] Chest pain [R07.9]  DISCHARGE DIAGNOSIS:  Active Problems:   Chest pain   SECONDARY DIAGNOSIS:   Past Medical History  Diagnosis Date  . Hypertension      ADMITTING HISTORY  Victor Dillon is a 81 y.o. male with a known history of hypertension, osteoarthritis brought in from [place] secondary to chest pain. Noticed chest pain in the middle of the chest. No radiation to the jaw, no radiation of the arms. No aggravating factors. No relieving factors. The chest pain was around 7 out of 10 in severe day when it happened. No cough, no shortness of breath, no exertional dyspnea. Because of rising second troponin. I asked to admit the patient. Denies chest pain at this time. Follows up with Dr. Clayborn Bigness regularly, seen him in April.    HOSPITAL COURSE:   Patient was admitted onto the telemetry floor for chest pain. Dr. Nehemiah Massed saw the patient. A Lexiscan stress test was done which showed no reversible ischemia. EKG was unchanged. Troponin normal. Patient ablated in the hallway without any chest pain and was discharged back home in a stable condition.   CONSULTS OBTAINED:     DRUG ALLERGIES:  No Known Allergies  DISCHARGE MEDICATIONS:   Discharge Medication List as of 07/18/2015  5:58 PM    START taking these medications   Details  metoprolol succinate (TOPROL XL) 25 MG 24 hr tablet Take 1 tablet (25 mg total) by mouth daily., Starting 07/18/2015, Until Discontinued, Print      CONTINUE these medications which have NOT CHANGED   Details  acetaminophen (TYLENOL) 325 MG tablet Take 650 mg by  mouth every 4 (four) hours as needed., Until Discontinued, Historical Med    amLODipine (NORVASC) 10 MG tablet Take 10 mg by mouth daily., Until Discontinued, Historical Med    aspirin EC 81 MG tablet Take 81 mg by mouth daily., Until Discontinued, Historical Med    calcium carbonate (CALCIUM 600) 600 MG TABS tablet Take 600 mg by mouth 2 (two) times daily., Until Discontinued, Historical Med    docusate sodium (COLACE) 100 MG capsule Take 2 capsules (200 mg total) by mouth 2 (two) times daily., Starting 05/17/2015, Until Discontinued, Print    finasteride (PROPECIA) 1 MG tablet Take 2 mg by mouth daily., Until Discontinued, Historical Med    hydrALAZINE (APRESOLINE) 50 MG tablet Take 50 mg by mouth 3 (three) times daily., Until Discontinued, Historical Med    HYDROcodone-acetaminophen (NORCO/VICODIN) 5-325 MG per tablet Take 1 tablet by mouth every 6 (six) hours as needed., Until Discontinued, Historical Med    lisinopril (PRINIVIL,ZESTRIL) 20 MG tablet Take 20 mg by mouth daily., Until Discontinued, Historical Med    senna (SENOKOT) 8.6 MG TABS tablet Take 2 tablets (17.2 mg total) by mouth 2 (two) times daily., Starting 05/17/2015, Until Discontinued, Print    terazosin (HYTRIN) 5 MG capsule Take 5 mg by mouth at bedtime., Until Discontinued, Historical Med         Today    VITAL SIGNS:  Blood pressure 139/62, pulse 62, temperature 98.2 F (36.8 C), temperature source Oral, resp. rate  18, height 5\' 8"  (1.727 m), weight 63.776 kg (140 lb 9.6 oz), SpO2 100 %.  I/O:  No intake or output data in the 24 hours ending 07/23/15 1335  PHYSICAL EXAMINATION:  Physical Exam  GENERAL:  81 y.o.-year-old patient lying in the bed with no acute distress.  LUNGS: Normal breath sounds bilaterally, no wheezing, rales,rhonchi or crepitation. No use of accessory muscles of respiration.  CARDIOVASCULAR: S1, S2 normal. No murmurs, rubs, or gallops.  ABDOMEN: Soft, non-tender, non-distended. Bowel  sounds present. No organomegaly or mass.  NEUROLOGIC: Moves all 4 extremities. PSYCHIATRIC: The patient is alert and oriented x 3.  SKIN: No obvious rash, lesion, or ulcer.   DATA REVIEW:   CBC  Recent Labs Lab 07/18/15 0510  WBC 5.9  HGB 10.9*  HCT 33.2*  PLT 210    Chemistries   Recent Labs Lab 07/18/15 0510  NA 143  K 4.4  CL 112*  CO2 25  GLUCOSE 95  BUN 33*  CREATININE 1.40*  CALCIUM 9.4    Cardiac Enzymes  Recent Labs Lab 07/17/15 1545  TROPONINI <0.03  0.05*    Microbiology Results  Results for orders placed or performed during the hospital encounter of 07/17/15  MRSA PCR Screening     Status: None   Collection Time: 07/17/15 11:32 PM  Result Value Ref Range Status   MRSA by PCR NEGATIVE NEGATIVE Final    Comment:        The GeneXpert MRSA Assay (FDA approved for NASAL specimens only), is one component of a comprehensive MRSA colonization surveillance program. It is not intended to diagnose MRSA infection nor to guide or monitor treatment for MRSA infections.     RADIOLOGY:  No results found.  Follow up with PCP in 1 week.  Management plans discussed with the patient, family and they are in agreement.  CODE STATUS:   TOTAL TIME TAKING CARE OF THIS PATIENT ON DAY OF DISCHARGE: more than 30 minutes.   Hillary Bow R M.D on 07/23/2015 at 1:35 PM  Between 7am to 6pm - Pager - [phone]  After 6pm go to [email or web address] - password [email or web address] [facility]  [facility]  Office  [phone]  CC: Primary care physician; No PCP Per Patient

## 2015-10-14 ENCOUNTER — Encounter: Payer: Self-pay | Admitting: *Deleted

## 2015-10-14 ENCOUNTER — Emergency Department
Admission: EM | Admit: 2015-10-14 | Discharge: 2015-10-14 | Disposition: A | Discharge status: Home / Self Care | Payer: Medicare Other | Attending: Emergency Medicine | Admitting: Emergency Medicine

## 2015-10-14 DIAGNOSIS — K5900 Constipation, unspecified: Secondary | ICD-10-CM | POA: Diagnosis present

## 2015-10-14 DIAGNOSIS — K5641 Fecal impaction: Secondary | ICD-10-CM | POA: Diagnosis not present

## 2015-10-14 DIAGNOSIS — I10 Essential (primary) hypertension: Secondary | ICD-10-CM | POA: Diagnosis not present

## 2015-10-14 DIAGNOSIS — K5901 Slow transit constipation: Secondary | ICD-10-CM

## 2015-10-14 DIAGNOSIS — Z87891 Personal history of nicotine dependence: Secondary | ICD-10-CM | POA: Diagnosis not present

## 2015-10-14 NOTE — ED Notes (Signed)
States has had no bm in several days, Dr Jimmye Norman removed large inpaction

## 2015-10-14 NOTE — ED Provider Notes (Signed)
[facility] Emergency Department Provider Note     Time seen: ----------------------------------------- 10:16 AM on 10/14/2015 -----------------------------------------    I have reviewed the triage vital signs and the nursing notes.   HISTORY  Chief Complaint No chief complaint on file.    HPI Victor Dillon is a 81 y.o. male who presents to ER after not having a bowel movement for the last week. Patient is a resident of independent living at [facility], had some left lower quadrant pain and constipation noted for 7-10 days. He's not had any nausea or other complaints.   Past Medical History  Diagnosis Date  . Hypertension     Patient Active Problem List   Diagnosis Date Noted  . Chest pain 07/17/2015    Past Surgical History  Procedure Laterality Date  . Appendectomy      Allergies Review of patient's allergies indicates no known allergies.  Social History Social History  Substance Use Topics  . Smoking status: Former Research scientist (life sciences)  . Smokeless tobacco: Not on file  . Alcohol Use: No    Review of Systems Constitutional: Negative for fever. Eyes: Negative for visual changes. ENT: Negative for sore throat. Cardiovascular: Negative for chest pain. Respiratory: Negative for shortness of breath. Gastrointestinal: Positive for abdominal pain and constipation Genitourinary: Negative for dysuria. Musculoskeletal: Negative for back pain. Skin: Negative for rash. Neurological: Negative for headaches, focal weakness or numbness.  10-point ROS otherwise negative.  ____________________________________________   PHYSICAL EXAM:  VITAL SIGNS: ED Triage Vitals  Enc Vitals Group     BP --      Pulse --      Resp --      Temp --      Temp src --      SpO2 --      Weight --      Height --      Head Cir --      Peak Flow --      Pain Score --      Pain Loc --      Pain Edu? --      Excl. in [place]? --     Constitutional: Alert and  oriented. Well appearing and in no distress. Eyes: Conjunctivae are normal. PERRL. Normal extraocular movements. ENT   Head: Normocephalic and atraumatic.   Nose: No congestion/rhinnorhea.   Mouth/Throat: Mucous membranes are moist.   Neck: No stridor. Cardiovascular: Normal rate, regular rhythm. Normal and symmetric distal pulses are present in all extremities. No murmurs, rubs, or gallops. Respiratory: Normal respiratory effort without tachypnea nor retractions. Breath sounds are clear and equal bilaterally. No wheezes/rales/rhonchi. Gastrointestinal: Left lower quadrant tenderness, no rebound guarding. Normal bowel sounds. Rectal: Patient is impacted, hard stool in the rectal vault, no blood present. Musculoskeletal: Nontender with normal range of motion in all extremities. No joint effusions.  No lower extremity tenderness nor edema. Neurologic:  Normal speech and language. No gross focal neurologic deficits are appreciated.  Skin:  Skin is warm, dry and intact. No rash noted. ____________________________________________  ED COURSE:  Pertinent labs & imaging results that were available during my care of the patient were reviewed by me and considered in my medical decision making (see chart for details). Patient is no acute distress, he was subsequently disimpacted with a large amount of stool evacuated. He was then given a soapsuds enema. ____________________________________________  FINAL ASSESSMENT AND PLAN  Constipation and fecal impaction  Plan: Patient with labs and imaging as dictated above.  Patient to continue Colace and Senokot at home. His fecal impaction has been resolved and constipation improved with disimpaction and enema.   Earleen Newport, MD   Earleen Newport, MD 10/14/15 [phone]

## 2015-10-14 NOTE — ED Notes (Signed)
residernt of independent living at [facility], has had llq abd pain and constipation for 7-10 days, no nausea

## 2015-10-14 NOTE — Discharge Instructions (Signed)
Fecal Impaction A fecal impaction happens when there is a large, firm amount of stool (or feces) that cannot be passed. The impacted stool is usually in the rectum, which is the lowest part of the large bowel. The impacted stool can block the colon and cause significant problems. CAUSES  The longer stool stays in the rectum, the harder it gets. Anything that slows down your bowel movements can lead to fecal impaction, such as:  Constipation. This can be a long-standing (chronic) problem or can happen suddenly (acute).  Painful conditions of the rectum, such as hemorrhoids or anal fissures. The pain of these conditions can make you try to avoid having bowel movements.  Narcotic pain-relieving medicines, such as methadone, morphine, or codeine.  Not drinking enough fluids.  Inactivity and bed rest over long periods of time.  Diseases of the brain or nervous system that damage the nerves controlling the muscles of the intestines. SIGNS AND SYMPTOMS   Lack of normal bowel movements or changes in bowel patterns.  Sense of fullness in the rectum but unable to pass stool.  Pain or cramps in the abdominal area (often after meals).  Thin, watery discharge from the rectum. DIAGNOSIS  Your health care provider may suspect that you have a fecal impaction based on your symptoms and a physical exam. This will include an exam of your rectum. Sometimes X-rays or lab testing may be needed to confirm the diagnosis and to be sure there are no other problems.  TREATMENT   Initially an impaction can be removed manually. Using a gloved finger, your health care provider can remove hard stool from your rectum.  Medicine is sometimes needed. A suppository or enema can be given in the rectum to soften the stool, which can stimulate a bowel movement. Medicines can also be given by mouth (orally).  Though rare, surgery may be needed if the colon has torn (perforated) due to blockage. HOME CARE INSTRUCTIONS    Develop regular bowel habits. This could include getting in the habit of having a bowel movement after your morning cup of coffee or after eating. Be sure to allow yourself enough time on the toilet.  Maintain a high-fiber diet.  Drink enough fluids to keep your urine clear or pale yellow as directed by your health care provider.  Exercise regularly.  If you begin to get constipated, increase the amount of fiber in your diet. Eat plenty of fruits, vegetables, whole wheat breads, bran, oatmeal, and similar products.  Take natural fiber laxatives or other laxatives only as directed by your health care provider. SEEK MEDICAL CARE IF:   You have ongoing rectal pain.  You require enemas or suppositories more than twice a week.  You have rectal bleeding.  You have continued problems, or you develop abdominal pain.  You have thin, pencil-like stools. SEEK IMMEDIATE MEDICAL CARE IF:  You have black or tarry stools. MAKE SURE YOU:   Understand these instructions.  Will watch your condition.  Will get help right away if you are not doing well or get worse.   This information is not intended to replace advice given to you by your health care provider. Make sure you discuss any questions you have with your health care provider.   Document Released: 07/31/2004 Document Revised: 08/29/2013 Document Reviewed: 05/15/2013 Elsevier Interactive Patient Education 2016 [employer].  Constipation, Adult Constipation is when a person:  Poops (has a bowel movement) less than 3 times a week.  Has a hard time pooping.  Has poop that is dry, hard, or bigger than normal. HOME CARE   Eat foods with a lot of fiber in them. This includes fruits, vegetables, beans, and whole grains such as brown rice.  Avoid fatty foods and foods with a lot of sugar. This includes french fries, hamburgers, cookies, candy, and soda.  If you are not getting enough fiber from food, take products with added  fiber in them (supplements).  Drink enough fluid to keep your pee (urine) clear or pale yellow.  Exercise on a regular basis, or as told by your doctor.  Go to the restroom when you feel like you need to poop. Do not hold it.  Only take medicine as told by your doctor. Do not take medicines that help you poop (laxatives) without talking to your doctor first. GET HELP RIGHT AWAY IF:   You have bright red blood in your poop (stool).  Your constipation lasts more than 4 days or gets worse.  You have belly (abdominal) or butt (rectal) pain.  You have thin poop (as thin as a pencil).  You lose weight, and it cannot be explained. MAKE SURE YOU:   Understand these instructions.  Will watch your condition.  Will get help right away if you are not doing well or get worse.   This information is not intended to replace advice given to you by your health care provider. Make sure you discuss any questions you have with your health care provider.   Document Released: 04/26/2008 Document Revised: 11/29/2014 Document Reviewed: 08/20/2013 Elsevier Interactive Patient Education [employer].

## 2015-10-14 NOTE — ED Notes (Signed)
Ss enema given with fair results

## 2015-12-15 ENCOUNTER — Emergency Department: Payer: Medicare Other

## 2015-12-15 ENCOUNTER — Observation Stay (HOSPITAL_COMMUNITY)
Admission: EM | Admit: 2015-12-15 | Discharge: 2015-12-17 | Disposition: A | Discharge status: Home / Self Care | Payer: Medicare Other | Source: Home / Self Care | Attending: Emergency Medicine | Admitting: Emergency Medicine

## 2015-12-15 ENCOUNTER — Encounter: Payer: Self-pay | Admitting: Emergency Medicine

## 2015-12-15 DIAGNOSIS — D62 Acute posthemorrhagic anemia: Secondary | ICD-10-CM

## 2015-12-15 DIAGNOSIS — I517 Cardiomegaly: Secondary | ICD-10-CM | POA: Insufficient documentation

## 2015-12-15 DIAGNOSIS — K922 Gastrointestinal hemorrhage, unspecified: Secondary | ICD-10-CM

## 2015-12-15 DIAGNOSIS — K5641 Fecal impaction: Secondary | ICD-10-CM | POA: Insufficient documentation

## 2015-12-15 DIAGNOSIS — E785 Hyperlipidemia, unspecified: Secondary | ICD-10-CM | POA: Insufficient documentation

## 2015-12-15 DIAGNOSIS — I2510 Atherosclerotic heart disease of native coronary artery without angina pectoris: Secondary | ICD-10-CM | POA: Insufficient documentation

## 2015-12-15 DIAGNOSIS — Z7982 Long term (current) use of aspirin: Secondary | ICD-10-CM

## 2015-12-15 DIAGNOSIS — Z87891 Personal history of nicotine dependence: Secondary | ICD-10-CM

## 2015-12-15 DIAGNOSIS — R079 Chest pain, unspecified: Secondary | ICD-10-CM

## 2015-12-15 DIAGNOSIS — N400 Enlarged prostate without lower urinary tract symptoms: Secondary | ICD-10-CM

## 2015-12-15 DIAGNOSIS — R109 Unspecified abdominal pain: Secondary | ICD-10-CM

## 2015-12-15 DIAGNOSIS — I959 Hypotension, unspecified: Secondary | ICD-10-CM

## 2015-12-15 DIAGNOSIS — N179 Acute kidney failure, unspecified: Secondary | ICD-10-CM | POA: Insufficient documentation

## 2015-12-15 DIAGNOSIS — K6289 Other specified diseases of anus and rectum: Secondary | ICD-10-CM | POA: Insufficient documentation

## 2015-12-15 DIAGNOSIS — K625 Hemorrhage of anus and rectum: Secondary | ICD-10-CM | POA: Diagnosis present

## 2015-12-15 DIAGNOSIS — Z79899 Other long term (current) drug therapy: Secondary | ICD-10-CM

## 2015-12-15 DIAGNOSIS — I10 Essential (primary) hypertension: Secondary | ICD-10-CM

## 2015-12-15 LAB — CBC WITH DIFFERENTIAL/PLATELET
BASOS ABS: 0 10*3/uL (ref 0–0.1)
BASOS PCT: 1 %
Eosinophils Absolute: 0.2 10*3/uL (ref 0–0.7)
Eosinophils Relative: 3 %
HEMATOCRIT: 33.5 % — AB (ref 40.0–52.0)
HEMOGLOBIN: 10.9 g/dL — AB (ref 13.0–18.0)
LYMPHS PCT: 26 %
Lymphs Abs: 1.6 10*3/uL (ref 1.0–3.6)
MCH: 27.8 pg (ref 26.0–34.0)
MCHC: 32.5 g/dL (ref 32.0–36.0)
MCV: 85.5 fL (ref 80.0–100.0)
MONO ABS: 0.4 10*3/uL (ref 0.2–1.0)
Monocytes Relative: 6 %
NEUTROS ABS: 3.9 10*3/uL (ref 1.4–6.5)
NEUTROS PCT: 64 %
Platelets: 221 10*3/uL (ref 150–440)
RBC: 3.92 MIL/uL — ABNORMAL LOW (ref 4.40–5.90)
RDW: 21.8 % — AB (ref 11.5–14.5)
WBC: 6.1 10*3/uL (ref 3.8–10.6)

## 2015-12-15 LAB — COMPREHENSIVE METABOLIC PANEL
ALBUMIN: 3.6 g/dL (ref 3.5–5.0)
ALT: 11 U/L — ABNORMAL LOW (ref 17–63)
ANION GAP: 7 (ref 5–15)
AST: 20 U/L (ref 15–41)
Alkaline Phosphatase: 51 U/L (ref 38–126)
BUN: 24 mg/dL — ABNORMAL HIGH (ref 6–20)
CO2: 24 mmol/L (ref 22–32)
Calcium: 9.4 mg/dL (ref 8.9–10.3)
Chloride: 112 mmol/L — ABNORMAL HIGH (ref 101–111)
Creatinine, Ser: 1.32 mg/dL — ABNORMAL HIGH (ref 0.61–1.24)
GFR calc non Af Amer: 44 mL/min — ABNORMAL LOW (ref >60)
GFR, EST AFRICAN AMERICAN: 51 mL/min — AB (ref >60)
GLUCOSE: 121 mg/dL — AB (ref 65–99)
Potassium: 4 mmol/L (ref 3.5–5.1)
Sodium: 143 mmol/L (ref 135–145)
TOTAL PROTEIN: 7.1 g/dL (ref 6.5–8.1)
Total Bilirubin: 0.6 mg/dL (ref 0.3–1.2)

## 2015-12-15 LAB — HEMOGLOBIN
HEMOGLOBIN: 10.4 g/dL — AB (ref 13.0–18.0)
Hemoglobin: 10.5 g/dL — ABNORMAL LOW (ref 13.0–18.0)

## 2015-12-15 LAB — PROTIME-INR
INR: 1.03
Prothrombin Time: 13.7 seconds (ref 11.4–15.0)

## 2015-12-15 LAB — APTT: APTT: 31 s (ref 24–36)

## 2015-12-15 MED ORDER — DOCUSATE SODIUM 100 MG PO CAPS
200.0000 mg | ORAL_CAPSULE | Freq: Two times a day (BID) | ORAL | Status: DC
  Administered 2015-12-15 – 2015-12-17 (×5): 200 mg via ORAL
  Filled 2015-12-15 (×5): qty 2

## 2015-12-15 MED ORDER — HYDRALAZINE HCL 25 MG PO TABS
50.0000 mg | ORAL_TABLET | Freq: Three times a day (TID) | ORAL | Status: DC
  Administered 2015-12-15 (×2): 50 mg via ORAL
  Filled 2015-12-15 (×2): qty 2

## 2015-12-15 MED ORDER — VITAMIN D 1000 UNITS PO TABS
1000.0000 [IU] | ORAL_TABLET | Freq: Every day | ORAL | Status: DC
  Administered 2015-12-15 – 2015-12-17 (×3): 1000 [IU] via ORAL
  Filled 2015-12-15 (×3): qty 1

## 2015-12-15 MED ORDER — TERAZOSIN HCL 5 MG PO CAPS
5.0000 mg | ORAL_CAPSULE | Freq: Every day | ORAL | Status: DC
  Administered 2015-12-15 – 2015-12-16 (×2): 5 mg via ORAL
  Filled 2015-12-15 (×2): qty 1

## 2015-12-15 MED ORDER — ACETAMINOPHEN 325 MG PO TABS: 650.0000 mg | ORAL_TABLET | ORAL | Status: DC | PRN

## 2015-12-15 MED ORDER — FINASTERIDE 1 MG PO TABS
1.0000 mg | ORAL_TABLET | Freq: Every day | ORAL | Status: DC
  Filled 2015-12-15 (×2): qty 1

## 2015-12-15 MED ORDER — CALCIUM CARBONATE 1250 (500 CA) MG PO TABS
0.5000 | ORAL_TABLET | Freq: Two times a day (BID) | ORAL | Status: DC
  Administered 2015-12-15 – 2015-12-16 (×2): 250 mg via ORAL
  Filled 2015-12-15 (×6): qty 0.5

## 2015-12-15 MED ORDER — LISINOPRIL 20 MG PO TABS
20.0000 mg | ORAL_TABLET | Freq: Every day | ORAL | Status: DC
  Administered 2015-12-15: 16:00:00 20 mg via ORAL
  Filled 2015-12-15 (×2): qty 1

## 2015-12-15 MED ORDER — HYDROCODONE-ACETAMINOPHEN 5-325 MG PO TABS: 1.0000 | ORAL_TABLET | Freq: Four times a day (QID) | ORAL | Status: DC | PRN

## 2015-12-15 MED ORDER — CHOLECALCIFEROL 250 MCG (10000 UT) PO TABS: 1.0000 | ORAL_TABLET | Freq: Every day | ORAL | Status: DC

## 2015-12-15 MED ORDER — ASPIRIN EC 81 MG PO TBEC
81.0000 mg | DELAYED_RELEASE_TABLET | Freq: Every day | ORAL | Status: DC
  Administered 2015-12-15 – 2015-12-17 (×3): 81 mg via ORAL
  Filled 2015-12-15 (×3): qty 1

## 2015-12-15 MED ORDER — METOPROLOL SUCCINATE ER 25 MG PO TB24
25.0000 mg | ORAL_TABLET | Freq: Every day | ORAL | Status: DC
  Administered 2015-12-15: 16:00:00 25 mg via ORAL
  Filled 2015-12-15: qty 1

## 2015-12-15 MED ORDER — CALCIUM CARBONATE 600 MG PO TABS: 600.0000 mg | ORAL_TABLET | Freq: Two times a day (BID) | ORAL | Status: DC

## 2015-12-15 MED ORDER — AMLODIPINE BESYLATE 10 MG PO TABS
10.0000 mg | ORAL_TABLET | Freq: Every day | ORAL | Status: DC
  Administered 2015-12-15 – 2015-12-17 (×2): 10 mg via ORAL
  Filled 2015-12-15 (×3): qty 1

## 2015-12-15 MED ORDER — LACTULOSE 10 GM/15ML PO SOLN
30.0000 g | Freq: Three times a day (TID) | ORAL | Status: DC
  Administered 2015-12-15 – 2015-12-17 (×6): 30 g via ORAL
  Filled 2015-12-15 (×6): qty 60

## 2015-12-15 MED ORDER — SIMVASTATIN 20 MG PO TABS
20.0000 mg | ORAL_TABLET | Freq: Every day | ORAL | Status: DC
  Administered 2015-12-15 – 2015-12-16 (×2): 20 mg via ORAL
  Filled 2015-12-15 (×2): qty 1

## 2015-12-15 NOTE — ED Notes (Signed)
Brought in via ems from [facility] with blood in stools this am

## 2015-12-15 NOTE — ED Notes (Signed)
Brought in via ems from home  States he noticed some blood in stools this am.. States he does have a hx of constipation  But has never had blood in stools   Per ems there was a moderate amt in toilet and small amt on floor  Having some discomfort in rectal area on arrival

## 2015-12-15 NOTE — Progress Notes (Signed)
Pt had 1 large bright red bloody bm while walking back to bed from bathroom. MD was notified, stat hemoglobin ordered. Vitals stable, will continue to monitor

## 2015-12-15 NOTE — Care Management Note (Signed)
Case Management Note  Patient Details  Name: Victor Dillon MRN: [id number] Date of Birth: 1916/11/28  Subjective/Objective:    Called guardian /POA to advise about MOON.He is aware. No questions. Will be here to see the patient later.                Action/Plan:   Expected Discharge Date:                  Expected Discharge Plan:     In-House Referral:     Discharge planning Services     Post Acute Care Choice:    Choice offered to:     DME Arranged:    DME Agency:     HH Arranged:    [place] Agency:     Status of Service:     Medicare Important Message Given:    Date Medicare IM Given:    Medicare IM give by:    Date Additional Medicare IM Given:    Additional Medicare Important Message give by:     If discussed at [place] of Stay Meetings, dates discussed:    Additional Comments:  Beau Fanny, RN 12/15/2015, 10:56 AM

## 2015-12-15 NOTE — ED Provider Notes (Signed)
[facility] Emergency Department Provider Note  ____________________________________________  Time seen: 9:20 AM  I have reviewed the triage vital signs and the nursing notes.   HISTORY  Chief Complaint Rectal Bleeding    HPI BRITAN EIGNER is a 82 y.o. male complaining of bloody stool. He's been having trouble with constipation over the last few days but denies any history of GI bleeding. No nausea or vomiting. Complains of some generalized cramping pain in the abdomen this morning and rectal pain prior to passing a small amount of hard stool. No syncope chest pain or shortness of breath.     Past Medical History  Diagnosis Date  . Hypertension      Patient Active Problem List   Diagnosis Date Noted  . Chest pain 07/17/2015     Past Surgical History  Procedure Laterality Date  . Appendectomy       Current Outpatient Rx  Name  Route  Sig  Dispense  Refill  . acetaminophen (TYLENOL) 325 MG tablet   Oral   Take 650 mg by mouth every 4 (four) hours as needed.         Marland Kitchen amLODipine (NORVASC) 10 MG tablet   Oral   Take 10 mg by mouth daily.         Marland Kitchen aspirin EC 81 MG tablet   Oral   Take 81 mg by mouth daily.         . calcium carbonate (CALCIUM 600) 600 MG TABS tablet   Oral   Take 600 mg by mouth 2 (two) times daily.         . Cholecalciferol 10000 units TABS   Oral   Take 1 tablet by mouth daily.         Marland Kitchen docusate sodium (COLACE) 100 MG capsule   Oral   Take 2 capsules (200 mg total) by mouth 2 (two) times daily.   120 capsule   0   . finasteride (PROPECIA) 1 MG tablet   Oral   Take 2 mg by mouth daily.         . hydrALAZINE (APRESOLINE) 50 MG tablet   Oral   Take 50 mg by mouth 3 (three) times daily.         Marland Kitchen HYDROcodone-acetaminophen (NORCO/VICODIN) 5-325 MG per tablet   Oral   Take 1 tablet by mouth every 6 (six) hours as needed.         Marland Kitchen lisinopril (PRINIVIL,ZESTRIL) 20 MG tablet   Oral  Take 20 mg by mouth daily.         . metoprolol succinate (TOPROL XL) 25 MG 24 hr tablet   Oral   Take 1 tablet (25 mg total) by mouth daily.   30 tablet   0   . simvastatin (ZOCOR) 20 MG tablet   Oral   Take 20 mg by mouth daily at 6 PM.         . terazosin (HYTRIN) 5 MG capsule   Oral   Take 5 mg by mouth at bedtime.         . senna (SENOKOT) 8.6 MG TABS tablet   Oral   Take 2 tablets (17.2 mg total) by mouth 2 (two) times daily. Patient not taking: Reported on 12/15/2015   120 each   0      Allergies Review of patient's allergies indicates no known allergies.   No family history on file.  Social History Social History  Substance Use Topics  .  Smoking status: Former Research scientist (life sciences)  . Smokeless tobacco: None  . Alcohol Use: No    Review of Systems  Constitutional:   No fever or chills. No weight changes Eyes:   No blurry vision or double vision.  ENT:   No sore throat. Cardiovascular:   No chest pain. Respiratory:   No dyspnea or cough. Gastrointestinal:   Negative for abdominal pain, vomiting and diarrhea.  Positive bright red bloody stool. Genitourinary:   Negative for dysuria, urinary retention, bloody urine, or difficulty urinating. Musculoskeletal:   Negative for back pain. No joint swelling or pain. Skin:   Negative for rash. Neurological:   Negative for headaches, focal weakness or numbness. Psychiatric:  No anxiety or depression.   Endocrine:  No hot/cold intolerance, changes in energy, or sleep difficulty.  10-point ROS otherwise negative.  ____________________________________________   PHYSICAL EXAM:  VITAL SIGNS: ED Triage Vitals  Enc Vitals Group     BP 12/15/15 0906 195/92 mmHg     Pulse Rate 12/15/15 0906 71     Resp 12/15/15 0906 18     Temp 12/15/15 0906 97.8 F (36.6 C)     Temp src --      SpO2 12/15/15 0906 98 %     Weight 12/15/15 0906 160 lb (72.576 kg)     Height 12/15/15 0906 5\' 8"  (1.727 m)     Head Cir --      Peak  Flow --      Pain Score 12/15/15 0906 7     Pain Loc --      Pain Edu? --      Excl. in [place]? --     Vital signs reviewed, nursing assessments reviewed.   Constitutional:   Alert and oriented. Well appearing and in no distress. Eyes:   No scleral icterus. No conjunctival pallor. PERRL. EOMI ENT   Head:   Normocephalic and atraumatic.   Nose:   No congestion/rhinnorhea. No septal hematoma   Mouth/Throat:   MMM, no pharyngeal erythema. No peritonsillar mass. No uvula shift.   Neck:   No stridor. No SubQ emphysema. No meningismus. Hematological/Lymphatic/Immunilogical:   No cervical lymphadenopathy. Cardiovascular:   RRR. Normal and symmetric distal pulses are present in all extremities. No murmurs, rubs, or gallops. Respiratory:   Normal respiratory effort without tachypnea nor retractions. Breath sounds are clear and equal bilaterally. No wheezes/rales/rhonchi. Gastrointestinal:   Soft and nontender. Generalized fullness. No distention. There is no CVA tenderness.  No rebound, rigidity, or guarding. Rectal exam reveals a significant amount of fresh red blood in the rectum and impacted hard stool in the rectum Genitourinary:   deferred Musculoskeletal:   Nontender with normal range of motion in all extremities. No joint effusions.  No lower extremity tenderness.  No edema. Neurologic:   Normal speech and language.  CN 2-10 normal. Motor grossly intact.  No gross focal neurologic deficits are appreciated.  Skin:    Skin is warm, dry and intact. No rash noted.  No petechiae, purpura, or bullae. Psychiatric:   Mood and affect are normal. Speech and behavior are normal. Patient exhibits appropriate insight and judgment.  ____________________________________________    LABS (pertinent positives/negatives) (all labs ordered are listed, but only abnormal results are displayed) Labs Reviewed  COMPREHENSIVE METABOLIC PANEL - Abnormal; Notable for the following:    Chloride 112  (*)    Glucose, Bld 121 (*)    BUN 24 (*)    Creatinine, Ser 1.32 (*)    ALT 11 (*)  GFR calc non Af Amer 44 (*)    GFR calc Af Amer 51 (*)    All other components within normal limits  CBC WITH DIFFERENTIAL/PLATELET - Abnormal; Notable for the following:    RBC 3.92 (*)    Hemoglobin 10.9 (*)    HCT 33.5 (*)    RDW 21.8 (*)    All other components within normal limits  PROTIME-INR  APTT   ____________________________________________   EKG  Interpreted by me Sinus rhythm rate of 71, normal axis intervals. Full does criteria for LVH in the anterior leads. Diffuse T-wave inversions consistent with LVH repolarization abnormality  ____________________________________________    RADIOLOGY  X-ray abdominal series reveals moderate stool burden, no free air or other abnormalities.  ____________________________________________   PROCEDURES ------------------------------------------------------------------------------------------------------------------- Fecal Disimpaction Procedure Note:  Performed by me:  Patient placed in the lateral recumbent position with knees drawn towards chest. Nurse present for patient support. Large amount of hard brown stool removed. Copious fresh red blood coating all the stool. No complications during procedure.   ------------------------------------------------------------------------------------------------------------------    ____________________________________________   INITIAL IMPRESSION / ASSESSMENT AND PLAN / ED COURSE  Pertinent labs & imaging results that were available during my care of the patient were reviewed by me and considered in my medical decision making (see chart for details).  Patient presents with fecal impaction and lower GI bleed. No hemorrhoids on exam to explain the symptoms. No prior GI workup or bleeds on the electronic medical record that I can see. He is vital signs are stable as is his hemoglobin. I  discussed the case with the hospitalist for further workup.     ____________________________________________   FINAL CLINICAL IMPRESSION(S) / ED DIAGNOSES  Final diagnoses:  Acute lower GI bleeding  Fecal impaction of rectum ([place])      Carrie Mew, MD 12/15/15 1036

## 2015-12-15 NOTE — ED Notes (Signed)
Up to bathroom   Ambulates slowly with walker

## 2015-12-15 NOTE — H&P (Signed)
[name]    MR#:  [id number]  DATE OF BIRTH:  [birth year]  DATE OF ADMISSION:  12/15/2015  PRIMARY CARE PHYSICIAN: No PCP Per Patient   REQUESTING/REFERRING PHYSICIAN: Dr. Joni Fears  CHIEF COMPLAINT:  Bright red blood per rectum Caregiver present in the emergency room  HISTORY OF PRESENT ILLNESS:  Victor Dillon  is a 82 y.o. male with a known history of hypertension, chronic constipation, hyperlipidemia, coronary artery disease, BPH comes to the emergency room accompanied by his caregiver after he had rectal bleed at [facility] [place]  Patient has history of constipation but caregiver. He is most of the the time in active does not drink much water. He takes Senokot daily.. In the ER rectal exam was done by ER M.D. found hard stool which was disimpacted. Hemorrhoids were not felt. Hemoglobin is stable. Patient otherwise is hemodynamically stable he's being admitted with rectal bleed suspected due to severe constipation.   PAST MEDICAL HISTORY:   Past Medical History  Diagnosis Date  . Hypertension     PAST SURGICAL HISTOIRY:   Past Surgical History  Procedure Laterality Date  . Appendectomy      SOCIAL HISTORY:   Social History  Substance Use Topics  . Smoking status: Former Research scientist (life sciences)  . Smokeless tobacco: Not on file  . Alcohol Use: No    FAMILY HISTORY:  Hypertension  DRUG ALLERGIES:  No Known Allergies  REVIEW OF SYSTEMS:  Review of Systems  Constitutional: Negative for fever, chills and weight loss.  HENT: Negative for ear discharge, ear pain and nosebleeds.   Eyes: Negative for blurred vision, pain and discharge.  Respiratory: Negative for sputum production, shortness of breath, wheezing and stridor.   Cardiovascular: Negative for chest pain, palpitations, orthopnea and PND.  Gastrointestinal: Positive for constipation and blood in stool. Negative for nausea, vomiting, abdominal pain  and diarrhea.  Genitourinary: Negative for urgency and frequency.  Musculoskeletal: Negative for back pain and joint pain.  Neurological: Positive for weakness. Negative for sensory change, speech change and focal weakness.  Psychiatric/Behavioral: Negative for depression and hallucinations. The patient is not nervous/anxious.   All other systems reviewed and are negative.    MEDICATIONS AT HOME:   Prior to Admission medications   Medication Sig Start Date End Date Taking? Authorizing Provider  acetaminophen (TYLENOL) 325 MG tablet Take 650 mg by mouth every 4 (four) hours as needed.   Yes Historical Provider, MD  amLODipine (NORVASC) 10 MG tablet Take 10 mg by mouth daily.   Yes Historical Provider, MD  aspirin EC 81 MG tablet Take 81 mg by mouth daily.   Yes Historical Provider, MD  calcium carbonate (CALCIUM 600) 600 MG TABS tablet Take 600 mg by mouth 2 (two) times daily.   Yes Historical Provider, MD  Cholecalciferol 10000 units TABS Take 1 tablet by mouth daily.   Yes Historical Provider, MD  docusate sodium (COLACE) 100 MG capsule Take 2 capsules (200 mg total) by mouth 2 (two) times daily. 05/17/15  Yes Carrie Mew, MD  finasteride (PROPECIA) 1 MG tablet Take 2 mg by mouth daily.   Yes Historical Provider, MD  hydrALAZINE (APRESOLINE) 50 MG tablet Take 50 mg by mouth 3 (three) times daily.   Yes Historical Provider, MD  HYDROcodone-acetaminophen (NORCO/VICODIN) 5-325 MG per tablet Take 1 tablet by mouth every 6 (six) hours as needed.   Yes Historical Provider, MD  lisinopril (PRINIVIL,ZESTRIL) 20 MG tablet Take  20 mg by mouth daily.   Yes Historical Provider, MD  metoprolol succinate (TOPROL XL) 25 MG 24 hr tablet Take 1 tablet (25 mg total) by mouth daily. 07/18/15  Yes Srikar Sudini, MD  simvastatin (ZOCOR) 20 MG tablet Take 20 mg by mouth daily at 6 PM.   Yes Historical Provider, MD  terazosin (HYTRIN) 5 MG capsule Take 5 mg by mouth at bedtime.   Yes Historical Provider, MD   senna (SENOKOT) 8.6 MG TABS tablet Take 2 tablets (17.2 mg total) by mouth 2 (two) times daily. Patient not taking: Reported on 12/15/2015 05/17/15   Carrie Mew, MD      VITAL SIGNS:  Blood pressure 181/119, pulse 60, temperature 97.8 F (36.6 C), resp. rate 22, height 5\' 8"  (1.727 m), weight 72.576 kg (160 lb), SpO2 99 %.  PHYSICAL EXAMINATION:  GENERAL:  82 y.o.-year-old patient lying in the bed with no acute distress.  EYES: Pupils equal, round, reactive to light and accommodation. No scleral icterus. Extraocular muscles intact.  HEENT: Head atraumatic, normocephalic. Oropharynx and nasopharynx clear.  NECK:  Supple, no jugular venous distention. No thyroid enlargement, no tenderness.  LUNGS: Normal breath sounds bilaterally, no wheezing, rales,rhonchi or crepitation. No use of accessory muscles of respiration.  CARDIOVASCULAR: S1, S2 normal. No murmurs, rubs, or gallops.  ABDOMEN: Soft, nontender, nondistended. Bowel sounds present. No organomegaly or mass.  EXTREMITIES: No pedal edema, cyanosis, or clubbing.  NEUROLOGIC: Cranial nerves II through XII are intact. Muscle strength 5/5 in all extremities. Sensation intact. Gait not checked.  PSYCHIATRIC: The patient is alert and oriented x 3.  SKIN: No obvious rash, lesion, or ulcer.   LABORATORY PANEL:   CBC  Recent Labs Lab 12/15/15 0910  WBC 6.1  HGB 10.9*  HCT 33.5*  PLT 221   ------------------------------------------------------------------------------------------------------------------  Chemistries   Recent Labs Lab 12/15/15 0910  NA 143  K 4.0  CL 112*  CO2 24  GLUCOSE 121*  BUN 24*  CREATININE 1.32*  CALCIUM 9.4  AST 20  ALT 11*  ALKPHOS 51  BILITOT 0.6   ------------------------------------------------------------------------------------------------------------------  Cardiac Enzymes No results for input(s): TROPONINI in the last 168  hours. ------------------------------------------------------------------------------------------------------------------  RADIOLOGY:  Dg Abd Acute W/chest  12/15/2015  CLINICAL DATA:  Rectal bleeding, abdominal cramping and pain. EXAM: DG ABDOMEN ACUTE W/ 1V CHEST COMPARISON:  05/17/2015 FINDINGS: Heart is mildly enlarged. Lungs are clear. No effusions. No acute bony abnormality. Moderate stool burden throughout the colon. Gas throughout nondistended large and small bowel. No evidence of bowel obstruction. No free air organomegaly. No suspicious calcification. No acute bony abnormality. IMPRESSION: Moderate stool burden throughout the colon. No evidence of bowel obstruction or free air. No active cardiopulmonary disease.  Mild cardiomegaly. Electronically Signed   By: Rolm Baptise M.D.   On: 12/15/2015 09:52    EKG:   Sinus rhythm, LVH IMPRESSION AND PLAN:   Victor Dillon  is a 82 y.o. male with a known history of hypertension, chronic constipation, hyperlipidemia, coronary artery disease, BPH comes to the emergency room accompanied by his caregiver after he had rectal bleed at [facility] [place]   1. bright red blood per rectum suspect due to severe constipation -Patient had fecal impaction. ER M.D. did rectal exam and disimpacted some of the stool which appeared very hard. -Per caregiver patient has significant constipation.  -Will admit to medical floor -Attempt to clean his bowels with lactulose, Senokot, docusate, enema if needed -Patient may have some bleeding secondary to severe constipation -Monitor  H&H and transfuse as needed -Consider GI consultation if needed  2. Hypertension -Continue lisinopril, metoprolol, hydralazine, amlodipine    3. Hyperlipidemia on simvastatin  4. BPH continue Hytrin and finasteride  5. Physical therapy and social worker for discharge planning patient is from [facility] [place]   All the records are reviewed and case discussed with ED provider. Management  plans discussed with the patient, family and they are in agreement.  CODE STATUS: *full for now. Discuss CODE STATUS with patient's legal guardian Mr. Glendell Docker   TOTAL TIME TAKING CARE OF THIS PATIENT: 45tes.    Victor Dillon M.D on 12/15/2015 at 11:01 AM  Between 7am to 6pm - Pager - [phone]  After 6pm go to [email or web address] - password [email or web address] [facility]  [place] [facility]  Office  [phone]  CC: Primary care physician; No PCP Per Patient

## 2015-12-15 NOTE — Care Management Obs Status (Signed)
[place] NOTIFICATION   Patient Details  Name: ARTORIUS EGELAND MRN: [id number] Date of Birth: 06-Jan-1917   Medicare Observation Status Notification Given:  Yes    Beau Fanny, RN 12/15/2015, 10:57 AM

## 2015-12-16 DIAGNOSIS — K625 Hemorrhage of anus and rectum: Secondary | ICD-10-CM

## 2015-12-16 LAB — ABO/RH: ABO/RH(D): O POS

## 2015-12-16 LAB — TYPE AND SCREEN
ABO/RH(D): O POS
Antibody Screen: NEGATIVE

## 2015-12-16 LAB — CBC
HEMATOCRIT: 27.4 % — AB (ref 40.0–52.0)
Hemoglobin: 8.9 g/dL — ABNORMAL LOW (ref 13.0–18.0)
MCH: 28 pg (ref 26.0–34.0)
MCHC: 32.6 g/dL (ref 32.0–36.0)
MCV: 86 fL (ref 80.0–100.0)
PLATELETS: 175 10*3/uL (ref 150–440)
RBC: 3.19 MIL/uL — ABNORMAL LOW (ref 4.40–5.90)
RDW: 21.6 % — AB (ref 11.5–14.5)
WBC: 5 10*3/uL (ref 3.8–10.6)

## 2015-12-16 LAB — HEMOGLOBIN: Hemoglobin: 9.1 g/dL — ABNORMAL LOW (ref 13.0–18.0)

## 2015-12-16 NOTE — Progress Notes (Signed)
[facility] [place] NAME: Victor Dillon    MR#:  [id number]  DATE OF BIRTH:  [birth year]  SUBJECTIVE:   Patient had a bowel movement this morning with blood. Repeat hemoglobin is slightly lower than on admission. Patient is feeling nauseous this morning.  REVIEW OF SYSTEMS:    Review of Systems  Constitutional: Negative for fever, chills and malaise/fatigue.  HENT: Negative for sore throat.   Eyes: Negative for blurred vision.  Respiratory: Negative for cough, hemoptysis, shortness of breath and wheezing.   Cardiovascular: Negative for chest pain, palpitations and leg swelling.  Gastrointestinal: Positive for blood in stool. Negative for nausea, vomiting, abdominal pain and diarrhea.  Genitourinary: Negative for dysuria.  Musculoskeletal: Negative for back pain.  Neurological: Negative for dizziness, tremors and headaches.  Endo/Heme/Allergies: Does not bruise/bleed easily.    Tolerating Diet: Yes clear liquid      DRUG ALLERGIES:  No Known Allergies  VITALS:  Blood pressure 91/52, pulse 70, temperature 98.5 F (36.9 C), temperature source Oral, resp. rate 18, height 5\' 8"  (1.727 m), weight 65.318 kg (144 lb), SpO2 100 %.  PHYSICAL EXAMINATION:   Physical Exam  Constitutional: He is oriented to person, place, and time and well-developed, well-nourished, and in no distress. No distress.  HENT:  Head: Normocephalic.  Eyes: No scleral icterus.  Neck: Normal range of motion. Neck supple. No JVD present. No tracheal deviation present.  Cardiovascular: Normal rate, regular rhythm and normal heart sounds.  Exam reveals no gallop and no friction rub.   No murmur heard. Pulmonary/Chest: Effort normal and breath sounds normal. No respiratory distress. He has no wheezes. He has no rales. He exhibits no tenderness.  Abdominal: Soft. Bowel sounds are normal. He exhibits no distension and no mass. There is no tenderness. There is  no rebound and no guarding.  Musculoskeletal: Normal range of motion. He exhibits no edema.  Neurological: He is alert and oriented to person, place, and time.  Skin: Skin is warm. No rash noted. No erythema.  Psychiatric: Affect and judgment normal.      LABORATORY PANEL:   CBC  Recent Labs Lab 12/16/15 0540  WBC 5.0  HGB 8.9*  HCT 27.4*  PLT 175   ------------------------------------------------------------------------------------------------------------------  Chemistries   Recent Labs Lab 12/15/15 0910  NA 143  K 4.0  CL 112*  CO2 24  GLUCOSE 121*  BUN 24*  CREATININE 1.32*  CALCIUM 9.4  AST 20  ALT 11*  ALKPHOS 51  BILITOT 0.6   ------------------------------------------------------------------------------------------------------------------  Cardiac Enzymes No results for input(s): TROPONINI in the last 168 hours. ------------------------------------------------------------------------------------------------------------------  RADIOLOGY:  Dg Abd Acute W/chest  12/15/2015  CLINICAL DATA:  Rectal bleeding, abdominal cramping and pain. EXAM: DG ABDOMEN ACUTE W/ 1V CHEST COMPARISON:  05/17/2015 FINDINGS: Heart is mildly enlarged. Lungs are clear. No effusions. No acute bony abnormality. Moderate stool burden throughout the colon. Gas throughout nondistended large and small bowel. No evidence of bowel obstruction. No free air organomegaly. No suspicious calcification. No acute bony abnormality. IMPRESSION: Moderate stool burden throughout the colon. No evidence of bowel obstruction or free air. No active cardiopulmonary disease.  Mild cardiomegaly. Electronically Signed   By: Rolm Baptise M.D.   On: 12/15/2015 09:52     ASSESSMENT AND PLAN:   82 year old male with a history of hypertension and chronic constipation who presented with bright red blood per rectum.  1. Bright red blood per rectum: The sounds to be due to  chronic constipation and a hard stool  which was disimpacted. His hemoglobin did drop slightly and therefore patient's hemoglobin needs to be continued to be monitored. GI consultation is pending. It is unlikely that this patient will require colonoscopy given his age.  2. Essential hypertension: Blood pressure this am was low. Blood pressure needs to be monitored. I will need to hold lisinopril, Toprol, hydralazine and Norvasc if repeat blood pressure still low..  3. Hyperlipidemia: Continue Zocor.  4. BPH: Continue Hytrin.  5. Acute blood loss anemia: This is secondary to problem #1. There is no indication for blood contusion. Continue to monitor hemoglobin. Hemoglobin is at noon.  Management plans discussed with the patient and he is in agreement.  CODE STATUS: Full  TOTAL TIME TAKING CARE OF THIS PATIENT: 30 minutes.     POSSIBLE D/C tomorrow, DEPENDING ON CLINICAL CONDITION.   Dyami Umbach M.D on 12/16/2015 at 9:41 AM  Between 7am to 6pm - Pager - [phone] After 6pm go to [email or web address] - password [email or web address] [facility]  [place] [facility]  Office  [phone]  CC: Primary care physician; No PCP Per Patient  Note: This dictation was prepared with Dragon dictation along with smaller phrase technology. Any transcriptional errors that result from this process are unintentional.

## 2015-12-16 NOTE — Consult Note (Signed)
[facility]  [address] [place], [place] [address] Phone: [phone] Fax : [phone]  Consultation  Referring Provider:     No ref. provider found Primary Care Physician:  No PCP Per Patient Primary Gastroenterologist:  None.         Reason for Consultation:     Hematochezia  Date of Admission:  12/15/2015 Date of Consultation:  12/16/2015         HPI:   COLLYN STALVEY is a 82 y.o. male who reports having some rectal bleeding. The patient has a history of being constipated and states after is contemplated he started having rectal bleeding. The patient takes Senokot daily. Patient had a hemoglobin of 10.5 when he was admitted that came down to 8.9 and a consult was called. This morning it was repeated and was 9.1. The patient denies having any further rectal bleeding. He denies any abdominal pain at this time.  Past Medical History  Diagnosis Date  . Hypertension     Past Surgical History  Procedure Laterality Date  . Appendectomy      Prior to Admission medications   Medication Sig Start Date End Date Taking? Authorizing Provider  acetaminophen (TYLENOL) 325 MG tablet Take 650 mg by mouth every 4 (four) hours as needed.   Yes Historical Provider, MD  amLODipine (NORVASC) 10 MG tablet Take 10 mg by mouth daily.   Yes Historical Provider, MD  aspirin EC 81 MG tablet Take 81 mg by mouth daily.   Yes Historical Provider, MD  calcium carbonate (CALCIUM 600) 600 MG TABS tablet Take 600 mg by mouth 2 (two) times daily.   Yes Historical Provider, MD  Cholecalciferol 10000 units TABS Take 1 tablet by mouth daily.   Yes Historical Provider, MD  docusate sodium (COLACE) 100 MG capsule Take 2 capsules (200 mg total) by mouth 2 (two) times daily. 05/17/15  Yes Carrie Mew, MD  finasteride (PROPECIA) 1 MG tablet Take 1 mg by mouth daily.    Yes Historical Provider, MD  hydrALAZINE (APRESOLINE) 50 MG tablet Take 50 mg by mouth 3 (three) times daily.   Yes  Historical Provider, MD  HYDROcodone-acetaminophen (NORCO/VICODIN) 5-325 MG per tablet Take 1 tablet by mouth every 6 (six) hours as needed.   Yes Historical Provider, MD  lisinopril (PRINIVIL,ZESTRIL) 20 MG tablet Take 20 mg by mouth daily.   Yes Historical Provider, MD  metoprolol succinate (TOPROL XL) 25 MG 24 hr tablet Take 1 tablet (25 mg total) by mouth daily. 07/18/15  Yes Srikar Sudini, MD  simvastatin (ZOCOR) 20 MG tablet Take 20 mg by mouth daily at 6 PM.   Yes Historical Provider, MD  terazosin (HYTRIN) 5 MG capsule Take 5 mg by mouth at bedtime.   Yes Historical Provider, MD  senna (SENOKOT) 8.6 MG TABS tablet Take 2 tablets (17.2 mg total) by mouth 2 (two) times daily. Patient not taking: Reported on 12/15/2015 05/17/15   Carrie Mew, MD    History reviewed. No pertinent family history.   Social History  Substance Use Topics  . Smoking status: Former Research scientist (life sciences)  . Smokeless tobacco: None  . Alcohol Use: No    Allergies as of 12/15/2015  . (No Known Allergies)    Review of Systems:    All systems reviewed and negative except where noted in HPI.   Physical Exam:  Vital signs in last 24 hours: Temp:  [97.8 F (36.6 C)-98.5 F (36.9 C)] 97.8 F (36.6 C) (01/24 1259) Pulse  Rate:  [57-70] 61 (01/24 1259) Resp:  [18-20] 20 (01/24 1259) BP: (91-187)/(47-70) 103/54 mmHg (01/24 1259) SpO2:  [97 %-100 %] 100 % (01/24 1259) Weight:  [144 lb (65.318 kg)] 144 lb (65.318 kg) (01/23 1354) Last BM Date: 12/15/15 General:   Pleasant, cooperative in NAD Head:  Normocephalic and atraumatic. Eyes:   No icterus.   Conjunctiva pink. PERRLA. Ears:  Normal auditory acuity. Neck:  Supple; no masses or thyroidomegaly Lungs: Respirations even and unlabored. Lungs clear to auscultation bilaterally.   No wheezes, crackles, or rhonchi.  Heart:  Regular rate and rhythm;  Without murmur, clicks, rubs or gallops Abdomen:  Soft, nondistended, nontender. Normal bowel sounds. No appreciable masses  or hepatomegaly.  No rebound or guarding.  Rectal:  Not performed. Msk:  Symmetrical without gross deformities.    Extremities:  Without edema, cyanosis or clubbing. Neurologic:  Alert and oriented x3;  grossly normal neurologically. Skin:  Intact without significant lesions or rashes. Cervical Nodes:  No significant cervical adenopathy. Psych:  Alert and cooperative. Normal affect.  LAB RESULTS:  Recent Labs  12/15/15 0910  12/15/15 1956 12/16/15 0540 12/16/15 1017  WBC 6.1  --   --  5.0  --   HGB 10.9*  < > 10.4* 8.9* 9.1*  HCT 33.5*  --   --  27.4*  --   PLT 221  --   --  175  --   < > = values in this interval not displayed. BMET  Recent Labs  12/15/15 0910  NA 143  K 4.0  CL 112*  CO2 24  GLUCOSE 121*  BUN 24*  CREATININE 1.32*  CALCIUM 9.4   LFT  Recent Labs  12/15/15 0910  PROT 7.1  ALBUMIN 3.6  AST 20  ALT 11*  ALKPHOS 51  BILITOT 0.6   PT/INR  Recent Labs  12/15/15 0910  LABPROT 13.7  INR 1.03    STUDIES: Dg Abd Acute W/chest  12/15/2015  CLINICAL DATA:  Rectal bleeding, abdominal cramping and pain. EXAM: DG ABDOMEN ACUTE W/ 1V CHEST COMPARISON:  05/17/2015 FINDINGS: Heart is mildly enlarged. Lungs are clear. No effusions. No acute bony abnormality. Moderate stool burden throughout the colon. Gas throughout nondistended large and small bowel. No evidence of bowel obstruction. No free air organomegaly. No suspicious calcification. No acute bony abnormality. IMPRESSION: Moderate stool burden throughout the colon. No evidence of bowel obstruction or free air. No active cardiopulmonary disease.  Mild cardiomegaly. Electronically Signed   By: Rolm Baptise M.D.   On: 12/15/2015 09:52      Impression / Plan:   ANITA BOCOOK is a 82 y.o. y/o male with who comes in with a history of constipation and a film of the abdomen showing moderate stool burden throughout the colon. The patient had a drop in his hemoglobin which is up this morning. The patient  has had no further bleeding and due to his age of 68 I do not think subjecting this patient to a colonoscopy at this time is prudent. I will sign off at this time. Please call if any further bleeding or any further concerns develop.   Thank you for involving me in the care of this patient.        Ollen Bowl, MD  12/16/2015, 1:45 PM   Note: This dictation was prepared with Dragon dictation along with smaller phrase technology. Any transcriptional errors that result from this process are unintentional.

## 2015-12-16 NOTE — Clinical Social Work Note (Signed)
Clinical Social Worker consulted for placement. CSW discussed pt with RN and RNCM. Pt is from [facility]. Pt is classified as Medicare OBS. Per RN, pt has sitters during the day. No PT order at this time. Plan is for pt to return home. CSW will sign off as no further needs identified. Please reconsult if a need arises prior to discharge.  Darden Dates, MSW, LCSW  Clinical Social Worker  [phone]

## 2015-12-17 LAB — BASIC METABOLIC PANEL
ANION GAP: 8 (ref 5–15)
ANION GAP: 10 (ref 5–15)
BUN: 30 mg/dL — ABNORMAL HIGH (ref 6–20)
BUN: 30 mg/dL — ABNORMAL HIGH (ref 6–20)
CALCIUM: 8.7 mg/dL — AB (ref 8.9–10.3)
CALCIUM: 9 mg/dL (ref 8.9–10.3)
CO2: 20 mmol/L — AB (ref 22–32)
CO2: 20 mmol/L — AB (ref 22–32)
CREATININE: 2.15 mg/dL — AB (ref 0.61–1.24)
CREATININE: 1.83 mg/dL — AB (ref 0.61–1.24)
Chloride: 108 mmol/L (ref 101–111)
Chloride: 110 mmol/L (ref 101–111)
GFR, EST AFRICAN AMERICAN: 28 mL/min — AB (ref >60)
GFR, EST AFRICAN AMERICAN: 34 mL/min — AB (ref >60)
GFR, EST NON AFRICAN AMERICAN: 24 mL/min — AB (ref >60)
GFR, EST NON AFRICAN AMERICAN: 30 mL/min — AB (ref >60)
Glucose, Bld: 100 mg/dL — ABNORMAL HIGH (ref 65–99)
Glucose, Bld: 124 mg/dL — ABNORMAL HIGH (ref 65–99)
Potassium: 3.8 mmol/L (ref 3.5–5.1)
Potassium: 3.9 mmol/L (ref 3.5–5.1)
SODIUM: 136 mmol/L (ref 135–145)
SODIUM: 140 mmol/L (ref 135–145)

## 2015-12-17 LAB — CBC
HCT: 25.4 % — ABNORMAL LOW (ref 40.0–52.0)
Hemoglobin: 8.2 g/dL — ABNORMAL LOW (ref 13.0–18.0)
MCH: 27.9 pg (ref 26.0–34.0)
MCHC: 32.3 g/dL (ref 32.0–36.0)
MCV: 86.1 fL (ref 80.0–100.0)
PLATELETS: 163 10*3/uL (ref 150–440)
RBC: 2.95 MIL/uL — ABNORMAL LOW (ref 4.40–5.90)
RDW: 20.5 % — AB (ref 11.5–14.5)
WBC: 6.1 10*3/uL (ref 3.8–10.6)

## 2015-12-17 MED ORDER — SODIUM CHLORIDE 0.9 % IV SOLN
INTRAVENOUS | Status: DC
  Administered 2015-12-17: 10:00:00 via INTRAVENOUS

## 2015-12-17 MED ORDER — CALCIUM CARBONATE ANTACID 500 MG PO CHEW
1.0000 | CHEWABLE_TABLET | Freq: Two times a day (BID) | ORAL | Status: DC
  Administered 2015-12-17: 200 mg via ORAL
  Filled 2015-12-17: qty 1

## 2015-12-17 MED ORDER — LACTULOSE 10 GM/15ML PO SOLN: 30.0000 g | Freq: Two times a day (BID) | ORAL | Status: AC | PRN

## 2015-12-17 MED ORDER — SODIUM CHLORIDE 0.9 % IV BOLUS (SEPSIS)
1000.0000 mL | INTRAVENOUS | Status: DC | PRN
  Administered 2015-12-17: 08:00:00 1000 mL via INTRAVENOUS

## 2015-12-17 NOTE — Evaluation (Signed)
Physical Therapy Evaluation Patient Details Name: Victor Dillon MRN: [id number] DOB: 1916-12-26 Today's Date: 12/17/2015   History of Present Illness  Patient is a 82 y/o male that presents with rectal bleeding and chronic constipation. He lives at [facility] and has sitters during the day, he is alone at night.   Clinical Impression  Patient is a pleasant 82 y/o male that has been rather inactive recently per his sitter who is present. He manages independently at night time and is able to have a sitter during the day for meal prep, hygiene, etc. During this session he demonstrates mild deficits in bed mobility and transfers, though this could be due to lethargy. Per sitter his gait speed (which is below norms for his age) is roughly similar to his baseline when prompted to ambulate at his normal speed. He does require prolonged time to transfer sit to stand, though this may be due to lethargy rather than true physical regression. Patient would benefit from continued skilled PT services to address the above deficits.     Follow Up Recommendations Home health PT;Supervision/Assistance - 24 hour (Could also consider bedside commode)    Equipment Recommendations  Rolling walker with 5" wheels;3in1 (PT)    Recommendations for Other Services       Precautions / Restrictions Precautions Precautions: Fall Restrictions Weight Bearing Restrictions: No      Mobility  Bed Mobility Overal bed mobility: Needs Assistance Bed Mobility: Supine to Sit     Supine to sit: Min guard;Min assist     General bed mobility comments: Patient requires prolonged time to completed transfer with cuing for bringing legs to EOB and use of hand rails.   Transfers Overall transfer level: Needs assistance Equipment used: Rolling walker (2 wheeled) Transfers: Sit to/from Stand Sit to Stand: Min guard         General transfer comment: Patient requires prolonged time to complete transfer from baseline  per sitter, though no physical assistance required and no loss of balance noted during transfer.   Ambulation/Gait Ambulation/Gait assistance: Min guard   Assistive device: Rolling walker (2 wheeled) Gait Pattern/deviations: Step-through pattern;Decreased step length - left;Decreased step length - right;Trunk flexed;Shuffle   Gait velocity interpretation: Below normal speed for age/gender General Gait Details: Patient demonstrates decreased gait speed, though he is able to slightly increase with prompting. No drifting noted and appropriate technique noted with turning to return to bed/chair. Generally indicative of deconditioning.   Stairs            Wheelchair Mobility    Modified Rankin (Stroke Patients Only)       Balance Overall balance assessment: Needs assistance Sitting-balance support: No upper extremity supported Sitting balance-Leahy Scale: Fair     Standing balance support: Bilateral upper extremity supported Standing balance-Leahy Scale: Fair                               Pertinent Vitals/Pain Pain Assessment:  (No pain noted during this session that patient complains about. )    Home Living Family/patient expects to be discharged to:: Assisted living ([facility] )               Home Equipment: Gilford Rile - 4 wheels      Prior Function Level of Independence: Independent with assistive device(s)         Comments: Per sitter, he has had no falls and goes to the bathroom alone  at night. He uses a [id number].      Hand Dominance        Extremity/Trunk Assessment   Upper Extremity Assessment: Overall WFL for tasks assessed           Lower Extremity Assessment: Generalized weakness         Communication   Communication: No difficulties  Cognition Arousal/Alertness: Awake/alert Behavior During Therapy: WFL for tasks assessed/performed Overall Cognitive Status: History of cognitive impairments - at baseline (Age related  cognitive deficits noted. Decreased memory, decreased response time. )                      General Comments      Exercises        Assessment/Plan    PT Assessment Patient needs continued PT services  PT Diagnosis Difficulty walking;Generalized weakness   PT Problem List Decreased strength;Decreased activity tolerance;Decreased safety awareness;Decreased knowledge of use of DME;Decreased balance  PT Treatment Interventions DME instruction;Gait training;Stair training;Therapeutic activities;Therapeutic exercise;Balance training   PT Goals (Current goals can be found in the Care Plan section) Acute Rehab PT Goals Patient Stated Goal: To return home  PT Goal Formulation: With patient Time For Goal Achievement: 12/31/15 Potential to Achieve Goals: Fair    Frequency Min 2X/week   Barriers to discharge Decreased caregiver support Patient is alone at night.     Co-evaluation               End of Session Equipment Utilized During Treatment: Gait belt Activity Tolerance: Patient tolerated treatment well;Patient limited by fatigue Patient left: in chair;with call bell/phone within reach;with family/visitor present;with chair alarm set Nurse Communication: Mobility status    Functional Assessment Tool Used: Clinical judgement  Functional Limitation: Mobility: Walking and moving around Mobility: Walking and Moving Around Current Status [id number]): At least 1 percent but less than 20 percent impaired, limited or restricted Mobility: Walking and Moving Around Goal Status [phone]): At least 1 percent but less than 20 percent impaired, limited or restricted    Time: 1155-1215 PT Time Calculation (min) (ACUTE ONLY): 20 min   Charges:   PT Evaluation $PT Eval Moderate Complexity: 1 Procedure     PT G Codes:   PT G-Codes **NOT FOR INPATIENT CLASS** Functional Assessment Tool Used: Clinical judgement  Functional Limitation: Mobility: Walking and moving around Mobility:  Walking and Moving Around Current Status [id number]): At least 1 percent but less than 20 percent impaired, limited or restricted Mobility: Walking and Moving Around Goal Status [phone]): At least 1 percent but less than 20 percent impaired, limited or restricted    Kerman Passey, PT, DPT    12/17/2015, 4:52 PM

## 2015-12-17 NOTE — Progress Notes (Signed)
Discharge instructions given to patient and caregiver Victor Dillon-prescription for lactulose-no distress noted

## 2015-12-17 NOTE — Discharge Summary (Signed)
[facility] [place] NAME: Victor Dillon    MR#:  [id number]  DATE OF BIRTH:  01-Aug-1917  DATE OF ADMISSION:  12/15/2015 ADMITTING PHYSICIAN: Fritzi Mandes, MD  DATE OF DISCHARGE: 12/17/2015  PRIMARY CARE PHYSICIAN: No PCP Per Patient    ADMISSION DIAGNOSIS:  Fecal impaction of rectum ([place]) [K56.41] Acute lower GI bleeding [K92.2]  DISCHARGE DIAGNOSIS:  Active Problems:   Rectal bleed   SECONDARY DIAGNOSIS:   Past Medical History  Diagnosis Date  . Hypertension     HOSPITAL COURSE:   82 year old male with a history of hypertension and chronic constipation who presented with bright red blood per rectum.  1. Bright red blood per rectum: The sounds to be due to chronic constipation and a hard stool which was disimpacted. Given his age GI did not recommend further evaluation. Patient's hemoglobin is 8.2 but does not require blood transfusion at this time.   2. Essential hypertension: Blood pressure has improved. We will discontinue some of his medications that he is taking at home and have him follow-up with his primary care physician..  3. Hyperlipidemia: Continue Zocor.  4. BPH: Continue Hytrin.  5. Acute blood loss anemia: This is secondary to problem #1. There is no indication for blood transfusion at this time. Hemoglobin is 8.2 at discharge. Patient has not had rectal bleeding for 2 days.  6. Acute renal failure: This is due to hypotension from yesterday. Patient received IV fluids. He needs a repeat BMP in 3 days.  DISCHARGE CONDITIONS AND DIET:  Patient is stable for discharge on a regular diet  CONSULTS OBTAINED:     DRUG ALLERGIES:  No Known Allergies  DISCHARGE MEDICATIONS:   Current Discharge Medication List    START taking these medications   Details  lactulose (CHRONULAC) 10 GM/15ML solution Take 45 mLs (30 g total) by mouth 2 (two) times daily as needed for mild constipation. Qty: 240 mL, Refills: 0       CONTINUE these medications which have NOT CHANGED   Details  acetaminophen (TYLENOL) 325 MG tablet Take 650 mg by mouth every 4 (four) hours as needed.    calcium carbonate (CALCIUM 600) 600 MG TABS tablet Take 600 mg by mouth 2 (two) times daily.    Cholecalciferol 10000 units TABS Take 1 tablet by mouth daily.    docusate sodium (COLACE) 100 MG capsule Take 2 capsules (200 mg total) by mouth 2 (two) times daily. Qty: 120 capsule, Refills: 0    finasteride (PROPECIA) 1 MG tablet Take 1 mg by mouth daily.     hydrALAZINE (APRESOLINE) 50 MG tablet Take 50 mg by mouth 3 (three) times daily.    HYDROcodone-acetaminophen (NORCO/VICODIN) 5-325 MG per tablet Take 1 tablet by mouth every 6 (six) hours as needed.    metoprolol succinate (TOPROL XL) 25 MG 24 hr tablet Take 1 tablet (25 mg total) by mouth daily. Qty: 30 tablet, Refills: 0    simvastatin (ZOCOR) 20 MG tablet Take 20 mg by mouth daily at 6 PM.    terazosin (HYTRIN) 5 MG capsule Take 5 mg by mouth at bedtime.    senna (SENOKOT) 8.6 MG TABS tablet Take 2 tablets (17.2 mg total) by mouth 2 (two) times daily. Qty: 120 each, Refills: 0      STOP taking these medications     amLODipine (NORVASC) 10 MG tablet      aspirin EC 81 MG tablet  lisinopril (PRINIVIL,ZESTRIL) 20 MG tablet               Today   CHIEF COMPLAINT:  Patient denies bloody bowel movements. Patient denies abdominal pain or cough.   VITAL SIGNS:  Blood pressure 121/56, pulse 70, temperature 97.8 F (36.6 C), temperature source Oral, resp. rate 17, height 5\' 8"  (1.727 m), weight 65.318 kg (144 lb), SpO2 97 %.   REVIEW OF SYSTEMS:  Review of Systems  Constitutional: Negative for fever, chills and malaise/fatigue.  HENT: Negative for sore throat.   Eyes: Negative for blurred vision.  Respiratory: Negative for cough, hemoptysis, shortness of breath and wheezing.   Cardiovascular: Negative for chest pain, palpitations and leg  swelling.  Gastrointestinal: Negative for nausea, vomiting, abdominal pain, diarrhea and blood in stool.  Genitourinary: Negative for dysuria.  Musculoskeletal: Negative for back pain.  Neurological: Negative for dizziness, tremors and headaches.  Endo/Heme/Allergies: Does not bruise/bleed easily.     PHYSICAL EXAMINATION:  GENERAL:  82 y.o.-year-old patient lying in the bed with no acute distress.  NECK:  Supple, no jugular venous distention. No thyroid enlargement, no tenderness.  LUNGS: Normal breath sounds bilaterally, no wheezing, rales,rhonchi  No use of accessory muscles of respiration.  CARDIOVASCULAR: S1, S2 normal. No murmurs, rubs, or gallops.  ABDOMEN: Soft, non-tender, non-distended. Bowel sounds present. No organomegaly or mass.  EXTREMITIES: No pedal edema, cyanosis, or clubbing.  PSYCHIATRIC: The patient is alert and oriented x 3.  SKIN: No obvious rash, lesion, or ulcer.   DATA REVIEW:   CBC  Recent Labs Lab 12/17/15 0542  WBC 6.1  HGB 8.2*  HCT 25.4*  PLT 163    Chemistries   Recent Labs Lab 12/15/15 0910 12/17/15 0542  NA 143 136  K 4.0 3.8  CL 112* 108  CO2 24 20*  GLUCOSE 121* 100*  BUN 24* 30*  CREATININE 1.32* 2.15*  CALCIUM 9.4 8.7*  AST 20  --   ALT 11*  --   ALKPHOS 51  --   BILITOT 0.6  --     Cardiac Enzymes No results for input(s): TROPONINI in the last 168 hours.  Microbiology Results  @MICRORSLT48 @  RADIOLOGY:  No results found.    Management plans discussed with the patient and he is in agreement. Stable for discharge   Patient should follow up with PCP 1 week  CODE STATUS:     Code Status Orders        Start     Ordered   12/15/15 1450  Full code   Continuous     12/15/15 1450    Code Status History    Date Active Date Inactive Code Status Order ID Comments User Context   07/17/2015  8:07 PM 07/18/2015  9:28 PM Full Code [id number]  Epifanio Lesches, MD ED    Advance Directive Documentation         Most Recent Value   Type of Advance Directive  Living will   Pre-existing out of facility DNR order (yellow form or pink MOST form)     "MOST" Form in Place?        TOTAL TIME TAKING CARE OF THIS PATIENT: 35 minutes.    Note: This dictation was prepared with Dragon dictation along with smaller phrase technology. Any transcriptional errors that result from this process are unintentional.  Jazara Swiney M.D on 12/17/2015 at 12:21 PM  Between 7am to 6pm - Pager - [phone] After 6pm go to [email or web address] - [place]  [facility]  Office  [phone]  CC: Primary care physician; No PCP Per Patient

## 2015-12-17 NOTE — Care Management (Addendum)
Admitted to [facility] with the diagnosis of rectal bleeding. Guardian is Darleene Cleaver [phone]). Mr. Victor Dillon  Updated. A resident of [place] x 1.5 years. States he goes to the [facility] for medical services. No home oxygen, No skilled facility. Uses a rollaytor to aid in ambulation. Caregivers in the home Monday-Friday.  Lise Auer is a private pay caregiver. States he does go to the dining room for meals. No falls. Good appetite.  Spoke with Dr. Benjie Karvonen. Will discharge back to [facility] today. Will give bolus prior to discharge. [name] stated that [facility] will transport. Shelbie Ammons RN MSN CCM Care Management [phone]

## 2015-12-17 NOTE — Progress Notes (Signed)
[facility] NAME: Victor Dillon    MR#:  [id number]  DATE OF BIRTH:  March 20, 1917  SUBJECTIVE:   No bloody bowel movements for 2 days  REVIEW OF SYSTEMS:    Review of Systems  Constitutional: Negative for fever, chills and malaise/fatigue.  HENT: Negative for sore throat.   Eyes: Negative for blurred vision.  Respiratory: Negative for cough, hemoptysis, shortness of breath and wheezing.   Cardiovascular: Negative for chest pain, palpitations and leg swelling.  Gastrointestinal: Negative for nausea, vomiting, abdominal pain, diarrhea and blood in stool.  Genitourinary: Negative for dysuria.  Musculoskeletal: Negative for back pain.  Neurological: Negative for dizziness, tremors and headaches.  Endo/Heme/Allergies: Does not bruise/bleed easily.    Tolerating Diet: Yes      DRUG ALLERGIES:  No Known Allergies  VITALS:  Blood pressure 121/56, pulse 70, temperature 97.8 F (36.6 C), temperature source Oral, resp. rate 17, height 5\' 8"  (1.727 m), weight 65.318 kg (144 lb), SpO2 97 %.  PHYSICAL EXAMINATION:   Physical Exam  Constitutional: He is well-developed, well-nourished, and in no distress. No distress.  HENT:  Head: Normocephalic.  Eyes: No scleral icterus.  Neck: Normal range of motion. Neck supple. No JVD present. No tracheal deviation present.  Cardiovascular: Normal rate, regular rhythm and normal heart sounds.  Exam reveals no gallop and no friction rub.   No murmur heard. Pulmonary/Chest: Effort normal and breath sounds normal. No stridor. No respiratory distress. He has no wheezes. He has no rales. He exhibits no tenderness.  Abdominal: Soft. Bowel sounds are normal. He exhibits no distension and no mass. There is no tenderness. There is no rebound and no guarding.  Musculoskeletal: Normal range of motion. He exhibits no edema.  Neurological: He is alert.  Skin: Skin is warm. No rash noted. No  erythema.  Psychiatric: Affect and judgment normal.      LABORATORY PANEL:   CBC  Recent Labs Lab 12/17/15 0542  WBC 6.1  HGB 8.2*  HCT 25.4*  PLT 163   ------------------------------------------------------------------------------------------------------------------  Chemistries   Recent Labs Lab 12/15/15 0910 12/17/15 0542  NA 143 136  K 4.0 3.8  CL 112* 108  CO2 24 20*  GLUCOSE 121* 100*  BUN 24* 30*  CREATININE 1.32* 2.15*  CALCIUM 9.4 8.7*  AST 20  --   ALT 11*  --   ALKPHOS 51  --   BILITOT 0.6  --    ------------------------------------------------------------------------------------------------------------------  Cardiac Enzymes No results for input(s): TROPONINI in the last 168 hours. ------------------------------------------------------------------------------------------------------------------  RADIOLOGY:  No results found.   ASSESSMENT AND PLAN:   82 year old male with a history of hypertension and chronic constipation who presented with bright red blood per rectum.  1. Bright red blood per rectum: The sounds to be due to chronic constipation and a hard stool which was disimpacted. Given his age GI did not recommend further evaluation. Patient's hemoglobin is 8.2 but does not require blood transfusion at this time.   2. Essential hypertension: Blood pressure has improved. We will discontinue some of his medications that he is taking at home and have him follow-up with his primary care physician..  3. Hyperlipidemia: Continue Zocor.  4. BPH: Continue Hytrin.  5. Acute blood loss anemia: This is secondary to problem #1. There is no indication for blood transfusion at this time. Hemoglobin is 8.2 at discharge. Patient has not had rectal bleeding for 2 days.  6. Acute renal failure: This  is due to hypotension from yesterday. Patient received IV fluids. He needs a repeat BMP in 3 days.      Management plans discussed with the patient and  he is in agreement.  CODE STATUS: Full  TOTAL TIME TAKING CARE OF THIS PATIENT: 25 minutes.     POSSIBLE D/C today, DEPENDING ON CLINICAL CONDITION.   Malaysia Crance M.D on 12/17/2015 at 12:23 PM  Between 7am to 6pm - Pager - [phone] After 6pm go to [email or web address] - password [email or web address] [facility]  [facility]  Office  [phone]  CC: Primary care physician; No PCP Per Patient  Note: This dictation was prepared with Dragon dictation along with smaller phrase technology. Any transcriptional errors that result from this process are unintentional.

## 2015-12-19 ENCOUNTER — Observation Stay: Admit: 2015-12-19 | Payer: Medicare Other

## 2015-12-19 ENCOUNTER — Observation Stay
Admit: 2015-12-19 | Discharge: 2015-12-19 | Disposition: A | Discharge status: Home / Self Care | Payer: Medicare Other | Attending: Internal Medicine | Admitting: Internal Medicine

## 2015-12-19 ENCOUNTER — Emergency Department: Payer: Medicare Other

## 2015-12-19 ENCOUNTER — Encounter: Payer: Self-pay | Admitting: Emergency Medicine

## 2015-12-19 ENCOUNTER — Inpatient Hospital Stay
Admission: EM | Admit: 2015-12-19 | Discharge: 2015-12-22 | DRG: 811 | Disposition: A | Discharge status: Home / Self Care | Payer: Medicare Other | Attending: Internal Medicine | Admitting: Internal Medicine

## 2015-12-19 ENCOUNTER — Observation Stay: Payer: Medicare Other

## 2015-12-19 DIAGNOSIS — I129 Hypertensive chronic kidney disease with stage 1 through stage 4 chronic kidney disease, or unspecified chronic kidney disease: Secondary | ICD-10-CM | POA: Diagnosis present

## 2015-12-19 DIAGNOSIS — I350 Nonrheumatic aortic (valve) stenosis: Secondary | ICD-10-CM | POA: Diagnosis present

## 2015-12-19 DIAGNOSIS — Z87891 Personal history of nicotine dependence: Secondary | ICD-10-CM

## 2015-12-19 DIAGNOSIS — Z79899 Other long term (current) drug therapy: Secondary | ICD-10-CM

## 2015-12-19 DIAGNOSIS — K5791 Diverticulosis of intestine, part unspecified, without perforation or abscess with bleeding: Secondary | ICD-10-CM | POA: Diagnosis present

## 2015-12-19 DIAGNOSIS — N183 Chronic kidney disease, stage 3 (moderate): Secondary | ICD-10-CM | POA: Diagnosis present

## 2015-12-19 DIAGNOSIS — I351 Nonrheumatic aortic (valve) insufficiency: Secondary | ICD-10-CM | POA: Diagnosis present

## 2015-12-19 DIAGNOSIS — D62 Acute posthemorrhagic anemia: Principal | ICD-10-CM | POA: Diagnosis present

## 2015-12-19 DIAGNOSIS — K922 Gastrointestinal hemorrhage, unspecified: Secondary | ICD-10-CM | POA: Diagnosis present

## 2015-12-19 DIAGNOSIS — E782 Mixed hyperlipidemia: Secondary | ICD-10-CM | POA: Diagnosis present

## 2015-12-19 DIAGNOSIS — I248 Other forms of acute ischemic heart disease: Secondary | ICD-10-CM | POA: Diagnosis present

## 2015-12-19 DIAGNOSIS — R079 Chest pain, unspecified: Secondary | ICD-10-CM | POA: Diagnosis present

## 2015-12-19 DIAGNOSIS — K5909 Other constipation: Secondary | ICD-10-CM | POA: Diagnosis present

## 2015-12-19 DIAGNOSIS — D649 Anemia, unspecified: Secondary | ICD-10-CM

## 2015-12-19 DIAGNOSIS — Z7982 Long term (current) use of aspirin: Secondary | ICD-10-CM

## 2015-12-19 DIAGNOSIS — Z66 Do not resuscitate: Secondary | ICD-10-CM | POA: Diagnosis present

## 2015-12-19 DIAGNOSIS — N400 Enlarged prostate without lower urinary tract symptoms: Secondary | ICD-10-CM | POA: Diagnosis present

## 2015-12-19 LAB — CBC WITH DIFFERENTIAL/PLATELET
BASOS ABS: 0 10*3/uL (ref 0–0.1)
Basophils Relative: 1 %
EOS PCT: 2 %
Eosinophils Absolute: 0.1 10*3/uL (ref 0–0.7)
HEMATOCRIT: 22.9 % — AB (ref 40.0–52.0)
HEMOGLOBIN: 7.5 g/dL — AB (ref 13.0–18.0)
LYMPHS ABS: 1.6 10*3/uL (ref 1.0–3.6)
LYMPHS PCT: 23 %
MCH: 28.3 pg (ref 26.0–34.0)
MCHC: 32.8 g/dL (ref 32.0–36.0)
MCV: 86.3 fL (ref 80.0–100.0)
Monocytes Absolute: 0.5 10*3/uL (ref 0.2–1.0)
Monocytes Relative: 7 %
NEUTROS ABS: 4.7 10*3/uL (ref 1.4–6.5)
NEUTROS PCT: 67 %
PLATELETS: 179 10*3/uL (ref 150–440)
RBC: 2.65 MIL/uL — AB (ref 4.40–5.90)
RDW: 21 % — ABNORMAL HIGH (ref 11.5–14.5)
WBC: 6.9 10*3/uL (ref 3.8–10.6)

## 2015-12-19 LAB — HEMOGLOBIN A1C: Hgb A1c MFr Bld: 5.6 % (ref 4.0–6.0)

## 2015-12-19 LAB — CBC
HCT: 26.2 % — ABNORMAL LOW (ref 40.0–52.0)
HEMOGLOBIN: 8.7 g/dL — AB (ref 13.0–18.0)
MCH: 28.9 pg (ref 26.0–34.0)
MCHC: 33.2 g/dL (ref 32.0–36.0)
MCV: 87.1 fL (ref 80.0–100.0)
PLATELETS: 171 10*3/uL (ref 150–440)
RBC: 3.01 MIL/uL — ABNORMAL LOW (ref 4.40–5.90)
RDW: 20.4 % — ABNORMAL HIGH (ref 11.5–14.5)
WBC: 6.9 10*3/uL (ref 3.8–10.6)

## 2015-12-19 LAB — BASIC METABOLIC PANEL
Anion gap: 7 (ref 5–15)
BUN: 26 mg/dL — ABNORMAL HIGH (ref 6–20)
CALCIUM: 9.3 mg/dL (ref 8.9–10.3)
CHLORIDE: 109 mmol/L (ref 101–111)
CO2: 22 mmol/L (ref 22–32)
CREATININE: 1.5 mg/dL — AB (ref 0.61–1.24)
GFR calc Af Amer: 43 mL/min — ABNORMAL LOW (ref >60)
GFR calc non Af Amer: 38 mL/min — ABNORMAL LOW (ref >60)
GLUCOSE: 166 mg/dL — AB (ref 65–99)
Potassium: 3.9 mmol/L (ref 3.5–5.1)
Sodium: 138 mmol/L (ref 135–145)

## 2015-12-19 LAB — PROTIME-INR
INR: 1.01
Prothrombin Time: 13.5 seconds (ref 11.4–15.0)

## 2015-12-19 LAB — TROPONIN I

## 2015-12-19 LAB — TSH: TSH: 0.616 u[IU]/mL (ref 0.350–4.500)

## 2015-12-19 MED ORDER — ACETAMINOPHEN 325 MG PO TABS: 650.0000 mg | ORAL_TABLET | Freq: Four times a day (QID) | ORAL | Status: DC | PRN

## 2015-12-19 MED ORDER — ONDANSETRON HCL 4 MG PO TABS: 4.0000 mg | ORAL_TABLET | Freq: Four times a day (QID) | ORAL | Status: DC | PRN

## 2015-12-19 MED ORDER — TECHNETIUM TC 99M-LABELED RED BLOOD CELLS IV KIT
22.8200 | PACK | Freq: Once | INTRAVENOUS | Status: AC | PRN
Start: 2015-12-19 — End: 2015-12-19
  Administered 2015-12-19: 22.82 via INTRAVENOUS

## 2015-12-19 MED ORDER — SENNA 8.6 MG PO TABS
2.0000 | ORAL_TABLET | Freq: Two times a day (BID) | ORAL | Status: DC
  Administered 2015-12-19 – 2015-12-22 (×5): 17.2 mg via ORAL
  Filled 2015-12-19 (×7): qty 2

## 2015-12-19 MED ORDER — HYDRALAZINE HCL 50 MG PO TABS
50.0000 mg | ORAL_TABLET | Freq: Three times a day (TID) | ORAL | Status: DC
  Administered 2015-12-19 (×2): 50 mg via ORAL
  Filled 2015-12-19: qty 1
  Filled 2015-12-19: qty 2

## 2015-12-19 MED ORDER — LACTULOSE 10 GM/15ML PO SOLN: 30.0000 g | Freq: Two times a day (BID) | ORAL | Status: DC | PRN

## 2015-12-19 MED ORDER — SODIUM CHLORIDE 0.9% FLUSH
3.0000 mL | Freq: Two times a day (BID) | INTRAVENOUS | Status: DC
  Administered 2015-12-19 – 2015-12-22 (×5): 3 mL via INTRAVENOUS

## 2015-12-19 MED ORDER — CALCIUM CARBONATE ANTACID 500 MG PO CHEW
1000.0000 mg | CHEWABLE_TABLET | Freq: Two times a day (BID) | ORAL | Status: DC
  Administered 2015-12-19 – 2015-12-22 (×7): 1000 mg via ORAL
  Filled 2015-12-19 (×9): qty 2

## 2015-12-19 MED ORDER — OXYCODONE HCL 5 MG PO TABS
5.0000 mg | ORAL_TABLET | ORAL | Status: DC | PRN
  Administered 2015-12-20: 5 mg via ORAL
  Filled 2015-12-19: qty 1

## 2015-12-19 MED ORDER — NITROGLYCERIN 2 % TD OINT
0.5000 [in_us] | TOPICAL_OINTMENT | Freq: Four times a day (QID) | TRANSDERMAL | Status: DC
  Administered 2015-12-19: 0.5 [in_us] via TOPICAL
  Administered 2015-12-19: 1 [in_us] via TOPICAL
  Administered 2015-12-19 – 2015-12-20 (×3): 0.5 [in_us] via TOPICAL
  Administered 2015-12-20: 1 [in_us] via TOPICAL
  Administered 2015-12-20 – 2015-12-21 (×2): 0.5 [in_us] via TOPICAL
  Administered 2015-12-21: 1 [in_us] via TOPICAL
  Administered 2015-12-21 (×2): 0.5 [in_us] via TOPICAL
  Administered 2015-12-22 (×2): 1 [in_us] via TOPICAL
  Filled 2015-12-19 (×12): qty 1

## 2015-12-19 MED ORDER — ASPIRIN 81 MG PO CHEW
324.0000 mg | CHEWABLE_TABLET | Freq: Once | ORAL | Status: AC
  Administered 2015-12-19: 324 mg via ORAL
  Filled 2015-12-19: qty 4

## 2015-12-19 MED ORDER — VITAMIN D 1000 UNITS PO TABS
1000.0000 [IU] | ORAL_TABLET | Freq: Every day | ORAL | Status: DC
Start: 2015-12-19 — End: 2015-12-22
  Administered 2015-12-19 – 2015-12-22 (×4): 1000 [IU] via ORAL
  Filled 2015-12-19 (×5): qty 1

## 2015-12-19 MED ORDER — TERAZOSIN HCL 5 MG PO CAPS
5.0000 mg | ORAL_CAPSULE | Freq: Every day | ORAL | Status: DC
  Administered 2015-12-19 – 2015-12-21 (×3): 5 mg via ORAL
  Filled 2015-12-19 (×3): qty 1

## 2015-12-19 MED ORDER — SIMVASTATIN 20 MG PO TABS
20.0000 mg | ORAL_TABLET | Freq: Every day | ORAL | Status: DC
  Administered 2015-12-19 – 2015-12-21 (×3): 20 mg via ORAL
  Filled 2015-12-19 (×3): qty 1

## 2015-12-19 MED ORDER — MORPHINE SULFATE (PF) 2 MG/ML IV SOLN: 2.0000 mg | INTRAVENOUS | Status: DC | PRN

## 2015-12-19 MED ORDER — DOCUSATE SODIUM 100 MG PO CAPS
200.0000 mg | ORAL_CAPSULE | Freq: Two times a day (BID) | ORAL | Status: DC
  Administered 2015-12-19 – 2015-12-22 (×6): 200 mg via ORAL
  Filled 2015-12-19 (×7): qty 2

## 2015-12-19 MED ORDER — ONDANSETRON HCL 4 MG/2ML IJ SOLN: 4.0000 mg | Freq: Four times a day (QID) | INTRAMUSCULAR | Status: DC | PRN

## 2015-12-19 MED ORDER — HEPARIN SODIUM (PORCINE) 5000 UNIT/ML IJ SOLN
5000.0000 [IU] | Freq: Three times a day (TID) | INTRAMUSCULAR | Status: DC
  Filled 2015-12-19: qty 1

## 2015-12-19 MED ORDER — ASPIRIN EC 81 MG PO TBEC
81.0000 mg | DELAYED_RELEASE_TABLET | Freq: Every day | ORAL | Status: DC
  Administered 2015-12-19 – 2015-12-22 (×3): 81 mg via ORAL
  Filled 2015-12-19 (×3): qty 1

## 2015-12-19 MED ORDER — METOPROLOL SUCCINATE ER 25 MG PO TB24
25.0000 mg | ORAL_TABLET | Freq: Every day | ORAL | Status: DC
  Administered 2015-12-19 – 2015-12-22 (×4): 25 mg via ORAL
  Filled 2015-12-19 (×4): qty 1

## 2015-12-19 MED ORDER — SODIUM CHLORIDE 0.9 % IV SOLN
INTRAVENOUS | Status: DC
  Administered 2015-12-19 – 2015-12-20 (×2): via INTRAVENOUS

## 2015-12-19 MED ORDER — ACETAMINOPHEN 650 MG RE SUPP: 650.0000 mg | Freq: Four times a day (QID) | RECTAL | Status: DC | PRN

## 2015-12-19 MED ORDER — FINASTERIDE 1 MG PO TABS: 1.0000 mg | ORAL_TABLET | Freq: Every day | ORAL | Status: DC

## 2015-12-19 NOTE — ED Provider Notes (Signed)
[facility] Emergency Department Provider Note  ____________________________________________  Time seen: 3:15 AM  I have reviewed the triage vital signs and the nursing notes.   HISTORY  Chief Complaint Chest Pain      HPI Victor Dillon is a 82 y.o. male presents with acute onset of left-sided chest pain "yesterday that is currently 1 out of 10 however patient states at maximum it was 7 out of 10 and described as pressure which radiated to his back and left arm.Patient denies any dyspnea no nausea vomiting or diaphoresis of note patient was recently seen in the emergency department on 12/15/2015 for rectal bleeding     Past Medical History  Diagnosis Date  . Hypertension     Patient Active Problem List   Diagnosis Date Noted  . Rectal bleed 12/15/2015  . Chest pain 07/17/2015    Past Surgical History  Procedure Laterality Date  . Appendectomy      Current Outpatient Rx  Name  Route  Sig  Dispense  Refill  . acetaminophen (TYLENOL) 325 MG tablet   Oral   Take 650 mg by mouth every 4 (four) hours as needed.         . calcium carbonate (CALCIUM 600) 600 MG TABS tablet   Oral   Take 600 mg by mouth 2 (two) times daily.         . Cholecalciferol 10000 units TABS   Oral   Take 1 tablet by mouth daily.         Marland Kitchen docusate sodium (COLACE) 100 MG capsule   Oral   Take 2 capsules (200 mg total) by mouth 2 (two) times daily.   120 capsule   0   . finasteride (PROPECIA) 1 MG tablet   Oral   Take 1 mg by mouth daily.          . hydrALAZINE (APRESOLINE) 50 MG tablet   Oral   Take 50 mg by mouth 3 (three) times daily.         Marland Kitchen HYDROcodone-acetaminophen (NORCO/VICODIN) 5-325 MG per tablet   Oral   Take 1 tablet by mouth every 6 (six) hours as needed.         . lactulose (CHRONULAC) 10 GM/15ML solution   Oral   Take 45 mLs (30 g total) by mouth 2 (two) times daily as needed for mild constipation.   240 mL   0   .  metoprolol succinate (TOPROL XL) 25 MG 24 hr tablet   Oral   Take 1 tablet (25 mg total) by mouth daily.   30 tablet   0   . simvastatin (ZOCOR) 20 MG tablet   Oral   Take 20 mg by mouth daily at 6 PM.         . terazosin (HYTRIN) 5 MG capsule   Oral   Take 5 mg by mouth at bedtime.         . senna (SENOKOT) 8.6 MG TABS tablet   Oral   Take 2 tablets (17.2 mg total) by mouth 2 (two) times daily. Patient not taking: Reported on 12/15/2015   120 each   0     Allergies No known drug allergies History reviewed. No pertinent family history.  Social History Social History  Substance Use Topics  . Smoking status: Former Research scientist (life sciences)  . Smokeless tobacco: None  . Alcohol Use: No    Review of Systems  Constitutional: Negative for fever. Eyes: Negative for visual changes.  ENT: Negative for sore throat. Cardiovascular: Positive for chest pain. Respiratory: Negative for shortness of breath. Gastrointestinal: Negative for abdominal pain, vomiting and diarrhea. Genitourinary: Negative for dysuria. Musculoskeletal: Negative for back pain. Skin: Negative for rash. Neurological: Negative for headaches, focal weakness or numbness.   10-point ROS otherwise negative.  ____________________________________________   PHYSICAL EXAM:  VITAL SIGNS: ED Triage Vitals  Enc Vitals Group     BP 12/19/15 0332 170/79 mmHg     Pulse Rate 12/19/15 0332 83     Resp 12/19/15 0332 20     Temp 12/19/15 0332 98.2 F (36.8 C)     Temp Source 12/19/15 0332 Oral     SpO2 12/19/15 0332 98 %     Weight --      Height --      Head Cir --      Peak Flow --      Pain Score 12/19/15 0333 7     Pain Loc --      Pain Edu? --      Excl. in [place]? --      Constitutional: Alert and oriented. Well appearing and in no distress. Eyes: Conjunctivae are normal. PERRL. Normal extraocular movements. ENT   Head: Normocephalic and atraumatic.   Nose: No congestion/rhinnorhea.   Mouth/Throat:  Mucous membranes are moist.   Neck: No stridor. Hematological/Lymphatic/Immunilogical: No cervical lymphadenopathy. Cardiovascular: Normal rate, regular rhythm. Normal and symmetric distal pulses are present in all extremities. Grade 2 systolic ejection murmur noted Respiratory: Normal respiratory effort without tachypnea nor retractions. Breath sounds are clear and equal bilaterally. No wheezes/rales/rhonchi. Gastrointestinal: Soft and nontender. No distention. There is no CVA tenderness. Genitourinary: deferred Musculoskeletal: Nontender with normal range of motion in all extremities. No joint effusions.  No lower extremity tenderness nor edema. Neurologic:  Normal speech and language. No gross focal neurologic deficits are appreciated. Speech is normal.  Skin:  Skin is warm, dry and intact. No rash noted. Psychiatric: Mood and affect are normal. Speech and behavior are normal. Patient exhibits appropriate insight and judgment.  ____________________________________________    LABS (pertinent positives/negatives)  Labs Reviewed  BASIC METABOLIC PANEL - Abnormal; Notable for the following:    Glucose, Bld 166 (*)    BUN 26 (*)    Creatinine, Ser 1.50 (*)    GFR calc non Af Amer 38 (*)    GFR calc Af Amer 43 (*)    All other components within normal limits  CBC - Abnormal; Notable for the following:    RBC 3.01 (*)    Hemoglobin 8.7 (*)    HCT 26.2 (*)    RDW 20.4 (*)    All other components within normal limits  CBC WITH DIFFERENTIAL/PLATELET - Abnormal; Notable for the following:    RBC 2.65 (*)    Hemoglobin 7.5 (*)    HCT 22.9 (*)    RDW 21.0 (*)    All other components within normal limits  TROPONIN I  TSH  HEMOGLOBIN A1C  PROTIME-INR  CBC  TYPE AND SCREEN     ____________________________________________   EKG  ED ECG REPORT I, Tasnim Balentine, [place], the attending physician, personally viewed and interpreted this ECG.   Date: 12/19/2015  EKG Time: 3:28 AM   Rate: 80  Rhythm: Normal sinus rhythm  Axis: None  Intervals: Normal  ST&T Change: None   ____________________________________________    RADIOLOGY     DG Chest Port 1 View (Final result) Result time: 12/19/15 04:31:10   Final  result by Rad Results In Interface (12/19/15 04:31:10)   Narrative:   CLINICAL DATA: Acute onset of left-sided chest pain, radiating to the left side of the back. Rectal bleeding. Initial encounter.  EXAM: PORTABLE CHEST 1 VIEW  COMPARISON: Chest radiograph performed 12/15/2015  FINDINGS: The lungs are well-aerated. Minimal bilateral atelectasis is noted. There is no evidence of pleural effusion or pneumothorax.  The cardiomediastinal silhouette is borderline normal in size. Right paratracheal prominence is relatively stable in appearance and likely reflects normal vasculature. No acute osseous abnormalities are seen.  IMPRESSION: Minimal bilateral atelectasis noted. Lungs otherwise clear.   Electronically Signed By: Garald Balding M.D. On: 12/19/2015 04:31         INITIAL IMPRESSION / ASSESSMENT AND PLAN / ED COURSE  Pertinent labs & imaging results that were available during my care of the patient were reviewed by me and considered in my medical decision making (see chart for details).    ____________________________________________   FINAL CLINICAL IMPRESSION(S) / ED DIAGNOSES  Final diagnoses:  GI bleed  Chest pain    Gregor Hams, MD 12/20/15 [phone]

## 2015-12-19 NOTE — ED Notes (Signed)
XR at bedside

## 2015-12-19 NOTE — Consult Note (Signed)
GI Inpatient Consult Note  Reason for Consult: LGIB   Attending Requesting Consult: Victor Posey Pronto  History of Present Illness:  Victor Dillon is a 82 y.o. male a/w c/p and LGIB.  Victor Dillon was just admitted on 12/16/15 for LGIB and was seen by Victor Dillon. Recommendation was no colonoscopy based on advanced age.  Hgb went from 10 to 8.5 during that admission.   His bleeding stopped, Hgb was stable and he00 was disharged on 1/25 and and now represents for c/p and LGIB.  The stools are apparently maroon in colon. Several today. Not assoc with abd pain or diarrhea.   He lives at home with a caretaker who witnessed the maroon cololred stools.   He went for tagged rbc scan today which was negative.  Hgb has declined from 8.5 prev to 7.5 currently.  Denies abd pain, n/v, f/c.     Past Medical History:  Past Medical History  Diagnosis Date  . Hypertension     Problem List: Patient Active Problem List   Diagnosis Date Noted  . Rectal bleed 12/15/2015  . Chest pain 07/17/2015    Past Surgical History: Past Surgical History  Procedure Laterality Date  . Appendectomy      Allergies: No Known Allergies  Home Medications: Prescriptions prior to admission  Medication Sig Dispense Refill Last Dose  . acetaminophen (TYLENOL) 325 MG tablet Take 650 mg by mouth every 4 (four) hours as needed.   PRN at PRN  . calcium carbonate (CALCIUM 600) 600 MG TABS tablet Take 600 mg by mouth 2 (two) times daily.   12/18/2015 at Unknown time  . Cholecalciferol 10000 units TABS Take 1 tablet by mouth daily.   12/18/2015 at Unknown time  . docusate sodium (COLACE) 100 MG capsule Take 2 capsules (200 mg total) by mouth 2 (two) times daily. 120 capsule 0 PRN at PRN  . finasteride (PROPECIA) 1 MG tablet Take 1 mg by mouth daily.    12/18/2015 at Unknown time  . hydrALAZINE (APRESOLINE) 50 MG tablet Take 50 mg by mouth 3 (three) times daily.   12/18/2015 at Unknown time  . HYDROcodone-acetaminophen (NORCO/VICODIN) 5-325 MG per  tablet Take 1 tablet by mouth every 6 (six) hours as needed.   12/18/2015 at Unknown time  . lactulose (CHRONULAC) 10 GM/15ML solution Take 45 mLs (30 g total) by mouth 2 (two) times daily as needed for mild constipation. 240 mL 0 12/18/2015 at Unknown time  . metoprolol succinate (TOPROL XL) 25 MG 24 hr tablet Take 1 tablet (25 mg total) by mouth daily. 30 tablet 0 12/18/2015 at Unknown time  . simvastatin (ZOCOR) 20 MG tablet Take 20 mg by mouth daily at 6 PM.   12/18/2015 at Unknown time  . terazosin (HYTRIN) 5 MG capsule Take 5 mg by mouth at bedtime.   12/18/2015 at Unknown time  . senna (SENOKOT) 8.6 MG TABS tablet Take 2 tablets (17.2 mg total) by mouth 2 (two) times daily. (Patient not taking: Reported on 12/15/2015) 120 each 0 Not Taking at Unknown time   Home medication reconciliation was not completed with the patient.   Scheduled Inpatient Medications:   . aspirin EC  81 mg Oral Daily  . calcium carbonate  1,000 mg Oral BID  . cholecalciferol  1,000 Units Oral Daily  . docusate sodium  200 mg Oral BID  . hydrALAZINE  50 mg Oral TID  . metoprolol succinate  25 mg Oral Daily  . nitroGLYCERIN  0.5 inch  Topical 4 times per day  . senna  2 tablet Oral BID  . simvastatin  20 mg Oral q1800  . sodium chloride flush  3 mL Intravenous Q12H  . terazosin  5 mg Oral QHS    Continuous Inpatient Infusions:   . sodium chloride 100 mL/hr at 12/19/15 0947    PRN Inpatient Medications:  acetaminophen **OR** acetaminophen, lactulose, morphine injection, ondansetron **OR** ondansetron (ZOFRAN) IV  Family History:  The patient's family history is negative for inflammatory bowel disorders, GI malignancy, or solid organ transplantation.  Social History:   reports that he has quit smoking. He does not have any smokeless tobacco history on file. He reports that he does not drink alcohol or use illicit drugs. The patient denies ETOH, tobacco, or drug use.   Review of Systems: Constitutional: Weight  is down Eyes: No changes in vision. ENT: No oral lesions, sore throat.  GI: see HPI.  Heme/Lymph:  + easy bruising.  CV: No chest pain.  GU: No hematuria.  Integumentary: No rashes.  Neuro: No headaches.  Psych: +depression/anxiety.  Endocrine: No heat/cold intolerance.  Allergic/Immunologic: No urticaria.  Resp: No cough, +SOB.  Musculoskeletal: No joint swelling.    Physical Examination: BP 102/47 mmHg  Pulse 73  Temp(Src) 98 F (36.7 C) (Oral)  Resp 18  Ht 5\' 8"  (1.727 m)  Wt 63.186 kg (139 lb 4.8 oz)  BMI 21.19 kg/m2  SpO2 100% Gen: NAD, alert and oriented x 3, elderly appearing.  HEENT: PEERLA, EOMI, Neck: supple, no JVD or thyromegaly Chest: CTA bilaterally, no wheezes, crackles, or other adventitious sounds CV: RRR, no m/g/c/r Abd: soft, NT, ND, +BS in all four quadrants; no HSM, guarding, ridigity, or rebound tenderness Ext: no edema, well perfused with 2+ pulses, Skin: no rash or lesions noted Lymph: no LAD  Data: Lab Results  Component Value Date   WBC 6.9 12/19/2015   HGB 7.5* 12/19/2015   HCT 22.9* 12/19/2015   MCV 86.3 12/19/2015   PLT 179 12/19/2015    Recent Labs Lab 12/17/15 0542 12/19/15 0440 12/19/15 1229  HGB 8.2* 8.7* 7.5*   Lab Results  Component Value Date   NA 138 12/19/2015   K 3.9 12/19/2015   CL 109 12/19/2015   CO2 22 12/19/2015   BUN 26* 12/19/2015   CREATININE 1.50* 12/19/2015   Lab Results  Component Value Date   ALT 11* 12/15/2015   AST 20 12/15/2015   ALKPHOS 51 12/15/2015   BILITOT 0.6 12/15/2015    Recent Labs Lab 12/15/15 0910 12/19/15 0440  APTT 31  --   INR 1.03 1.01   Assessment/Plan: Victor Dillon is a 82 y.o. male with likely LGIB.  Unfortunately, Hgb now down to 7.5.   Bleeding scan negative.  Would not plan for colonoscopy given advanced age and low likelihood of changing management with colonoscopy.   Recommendations: - obtain another bleeding scan if re-develops heavy bleeding - palliative care  consult - no plans for colonoscopy given advanced age and unlikely to change clinical management.   Thank you for the consult. Please call with questions or concerns.  Victor Dillon, Victor Blight, MD

## 2015-12-19 NOTE — Progress Notes (Signed)
[place] at [facility]                                                                                                                                                                                            Patient Demographics   Remy Kihm, is a 82 y.o. male, DOB - 26-Jun-1917, [id number]  Admit date - 12/19/2015   Admitting Physician Harrie Foreman, MD  Outpatient Primary MD for the patient is No PCP Per Patient   LOS -   Subjective:patient was recently seen for gi bleed and was thought to be related to constipation. Patient now presents with chest pain. Patient has been having dark color stools.     Review of Systems:   CONSTITUTIONAL: No documented fever. No fatigue, weakness. No weight gain, no weight loss.  EYES: No blurry or double vision.  ENT: No tinnitus. No postnasal drip. No redness of the oropharynx.  RESPIRATORY: No cough, no wheeze, no hemoptysis. No dyspnea.  CARDIOVASCULAR: Positive chest pain. No orthopnea. No palpitations. No syncope.  GASTROINTESTINAL: No nausea, no vomiting or diarrhea. No abdominal pain. Positive melena or hematochezia.  GENITOURINARY: No dysuria or hematuria.  ENDOCRINE: No polyuria or nocturia. No heat or cold intolerance.  HEMATOLOGY: No anemia. No bruising. No bleeding.  INTEGUMENTARY: No rashes. No lesions.  MUSCULOSKELETAL: No arthritis. No swelling. No gout.  NEUROLOGIC: No numbness, tingling, or ataxia. No seizure-type activity.  PSYCHIATRIC: No anxiety. No insomnia. No ADD.    Vitals:   Filed Vitals:   12/19/15 0930 12/19/15 1000 12/19/15 1100 12/19/15 1211  BP: 134/69 118/65 128/60 133/72  Pulse: 69 76 59 70  Temp:    97.9 F (36.6 C)  TempSrc:    Oral  Resp: 21 16 3 18   Height:    5\' 8"  (1.727 m)  Weight:    63.186 kg (139 lb 4.8 oz)  SpO2: 100% 100% 100% 100%    Wt Readings from Last 3 Encounters:  12/19/15 63.186 kg (139 lb 4.8 oz)  12/15/15 65.318 kg (144 lb)   10/14/15 69.854 kg (154 lb)     Intake/Output Summary (Last 24 hours) at 12/19/15 1600 Last data filed at 12/19/15 1432  Gross per 24 hour  Intake      0 ml  Output      0 ml  Net      0 ml    Physical Exam:   GENERAL: Pleasant-appearing in no apparent distress.  HEAD, EYES, EARS, NOSE AND THROAT: Atraumatic, normocephalic. Extraocular muscles are intact. Pupils equal and reactive to light. Sclerae anicteric. No conjunctival injection. No oro-pharyngeal  erythema.  NECK: Supple. There is no jugular venous distention. No bruits, no lymphadenopathy, no thyromegaly.  HEART: Regular rate and rhythm,. No murmurs, no rubs, no clicks.  LUNGS: Clear to auscultation bilaterally. No rales or rhonchi. No wheezes.  ABDOMEN: Soft, flat, nontender, nondistended. Has good bowel sounds. No hepatosplenomegaly appreciated.  EXTREMITIES: No evidence of any cyanosis, clubbing, or peripheral edema.  +2 pedal and radial pulses bilaterally.  NEUROLOGIC: The patient is alert, awake, and oriented x3 with no focal motor or sensory deficits appreciated bilaterally.  SKIN: Moist and warm with no rashes appreciated.  Psych: Not anxious, depressed LN: No inguinal LN enlargement    Antibiotics   Anti-infectives    None      Medications   Scheduled Meds: . aspirin EC  81 mg Oral Daily  . calcium carbonate  1,000 mg Oral BID  . cholecalciferol  1,000 Units Oral Daily  . docusate sodium  200 mg Oral BID  . hydrALAZINE  50 mg Oral TID  . metoprolol succinate  25 mg Oral Daily  . nitroGLYCERIN  0.5 inch Topical 4 times per day  . senna  2 tablet Oral BID  . simvastatin  20 mg Oral q1800  . sodium chloride flush  3 mL Intravenous Q12H  . terazosin  5 mg Oral QHS   Continuous Infusions: . sodium chloride 100 mL/hr at 12/19/15 0947   PRN Meds:.acetaminophen **OR** acetaminophen, lactulose, morphine injection, ondansetron **OR** ondansetron (ZOFRAN) IV   Data Review:   Micro Results No results  found for this or any previous visit (from the past 240 hour(s)).  Radiology Reports Dg Chest Port 1 View  12/19/2015  CLINICAL DATA:  Acute onset of left-sided chest pain, radiating to the left side of the back. Rectal bleeding. Initial encounter. EXAM: PORTABLE CHEST 1 VIEW COMPARISON:  Chest radiograph performed 12/15/2015 FINDINGS: The lungs are well-aerated. Minimal bilateral atelectasis is noted. There is no evidence of pleural effusion or pneumothorax. The cardiomediastinal silhouette is borderline normal in size. Right paratracheal prominence is relatively stable in appearance and likely reflects normal vasculature. No acute osseous abnormalities are seen. IMPRESSION: Minimal bilateral atelectasis noted.  Lungs otherwise clear. Electronically Signed   By: Garald Balding M.D.   On: 12/19/2015 04:31   Dg Abd Acute W/chest  12/15/2015  CLINICAL DATA:  Rectal bleeding, abdominal cramping and pain. EXAM: DG ABDOMEN ACUTE W/ 1V CHEST COMPARISON:  05/17/2015 FINDINGS: Heart is mildly enlarged. Lungs are clear. No effusions. No acute bony abnormality. Moderate stool burden throughout the colon. Gas throughout nondistended large and small bowel. No evidence of bowel obstruction. No free air organomegaly. No suspicious calcification. No acute bony abnormality. IMPRESSION: Moderate stool burden throughout the colon. No evidence of bowel obstruction or free air. No active cardiopulmonary disease.  Mild cardiomegaly. Electronically Signed   By: Rolm Baptise M.D.   On: 12/15/2015 09:52     CBC  Recent Labs Lab 12/15/15 0910  12/16/15 0540 12/16/15 1017 12/17/15 0542 12/19/15 0440 12/19/15 1229  WBC 6.1  --  5.0  --  6.1 6.9 6.9  HGB 10.9*  < > 8.9* 9.1* 8.2* 8.7* 7.5*  HCT 33.5*  --  27.4*  --  25.4* 26.2* 22.9*  PLT 221  --  175  --  163 171 179  MCV 85.5  --  86.0  --  86.1 87.1 86.3  MCH 27.8  --  28.0  --  27.9 28.9 28.3  MCHC 32.5  --  32.6  --  32.3 33.2 32.8  RDW 21.8*  --  21.6*  --   20.5* 20.4* 21.0*  LYMPHSABS 1.6  --   --   --   --   --  1.6  MONOABS 0.4  --   --   --   --   --  0.5  EOSABS 0.2  --   --   --   --   --  0.1  BASOSABS 0.0  --   --   --   --   --  0.0  < > = values in this interval not displayed.  Chemistries   Recent Labs Lab 12/15/15 0910 12/17/15 0542 12/17/15 1248 12/19/15 0440  NA 143 136 140 138  K 4.0 3.8 3.9 3.9  CL 112* 108 110 109  CO2 24 20* 20* 22  GLUCOSE 121* 100* 124* 166*  BUN 24* 30* 30* 26*  CREATININE 1.32* 2.15* 1.83* 1.50*  CALCIUM 9.4 8.7* 9.0 9.3  AST 20  --   --   --   ALT 11*  --   --   --   ALKPHOS 51  --   --   --   BILITOT 0.6  --   --   --    ------------------------------------------------------------------------------------------------------------------ estimated creatinine clearance is 25.7 mL/min (by C-G formula based on Cr of 1.5). ------------------------------------------------------------------------------------------------------------------ No results for input(s): HGBA1C in the last 72 hours. ------------------------------------------------------------------------------------------------------------------ No results for input(s): CHOL, HDL, LDLCALC, TRIG, CHOLHDL, LDLDIRECT in the last 72 hours. ------------------------------------------------------------------------------------------------------------------  Recent Labs  12/19/15 0440  TSH 0.616   ------------------------------------------------------------------------------------------------------------------ No results for input(s): VITAMINB12, FOLATE, FERRITIN, TIBC, IRON, RETICCTPCT in the last 72 hours.  Coagulation profile  Recent Labs Lab 12/15/15 0910 12/19/15 0440  INR 1.03 1.01    No results for input(s): DDIMER in the last 72 hours.  Cardiac Enzymes  Recent Labs Lab 12/19/15 0440  TROPONINI <0.03    ------------------------------------------------------------------------------------------------------------------ Invalid input(s): POCBNP    Assessment & Plan   This is a 82 year old African-American male admitted for chest pain. 1. Chest pain: Atypical; possibly anginal due to exertion as well as anemia. 2. GI bleed persistent: I will ask GI to see is getting a bleeding scan now to possibly localized 3 Essential hypertension: Continue metoprolol and hydralazine 4. BPH: Continue terazosin and Propecia 4. Constipation: Continue lactulose 5. DVT prophylaxis: SCDs 6. GI prophylaxis: None as the patient is not critically ill      Code Status Orders        Start     Ordered   12/19/15 1229  Full code   Continuous     12/19/15 1228    Code Status History    Date Active Date Inactive Code Status Order ID Comments User Context   12/15/2015  2:50 PM 12/17/2015  6:34 PM Full Code [id number]  Idalia Needle, RN Inpatient   07/17/2015  8:07 PM 07/18/2015  9:28 PM Full Code [id number]  Epifanio Lesches, MD ED    Advance Directive Documentation        Most Recent Value   Type of Advance Directive  Healthcare Power of [place], Living will   Pre-existing out of facility DNR order (yellow form or pink MOST form)     "MOST" Form in Place?             Consults GI cardiology DVT Prophylaxis  SCDs  Lab Results  Component Value Date   PLT 179 12/19/2015     Time Spent in minutes  17min  Dustin Flock M.D on 12/19/2015 at 4:00 PM  Between 7am to 6pm - Pager - [phone]  After 6pm go to [email or web address] - password [email or web address] [facility]   Office  [phone]

## 2015-12-19 NOTE — ED Notes (Signed)
Patient incontinent of stool at this time.

## 2015-12-19 NOTE — ED Notes (Signed)
Patient was incontinent of stool and stool was runny with bright red and dark red blood mixed.  Dr. Posey Pronto notified and he acknowledged this and instructed to hold heparin at this time.

## 2015-12-19 NOTE — H&P (Signed)
Victor Dillon is an 82 y.o. male.   Chief Complaint: Chest pain HPI: The patient presents emergency department complaining of chest pain. The pain began after he awoke tonight to urinate. The patient has a past medical history of prostatic hypertrophy and hypertension. He went to lay down when his chest began to hurt substernally and radiated slightly toward the left side of his chest. The patient describes the pain as squeezing in character. He denies associated shortness of breath, nausea, vomiting, diaphoresis or lightheadedness. In the emergency department the patient was given chewable aspirin 325 mg which relieved his pain some. He states that it is now 4 out of 10 in severity. Due to his ongoing chest pain the admitting physician aced Nitropaste on the place and chest prior to admission for cardiac evaluation.  Past Medical History  Diagnosis Date  . Hypertension     Past Surgical History  Procedure Laterality Date  . Appendectomy      History reviewed. No pertinent family history. denies history of diabetes or heart disease Social History:  reports that he has quit smoking. He does not have any smokeless tobacco history on file. He reports that he does not drink alcohol or use illicit drugs.  Allergies: No Known Allergies  Prior to Admission medications   Medication Sig Start Date End Date Taking? Authorizing Provider  acetaminophen (TYLENOL) 325 MG tablet Take 650 mg by mouth every 4 (four) hours as needed.   Yes Historical Provider, MD  calcium carbonate (CALCIUM 600) 600 MG TABS tablet Take 600 mg by mouth 2 (two) times daily.   Yes Historical Provider, MD  Cholecalciferol 10000 units TABS Take 1 tablet by mouth daily.   Yes Historical Provider, MD  docusate sodium (COLACE) 100 MG capsule Take 2 capsules (200 mg total) by mouth 2 (two) times daily. 05/17/15  Yes Carrie Mew, MD  finasteride (PROPECIA) 1 MG tablet Take 1 mg by mouth daily.    Yes Historical Provider, MD   hydrALAZINE (APRESOLINE) 50 MG tablet Take 50 mg by mouth 3 (three) times daily.   Yes Historical Provider, MD  HYDROcodone-acetaminophen (NORCO/VICODIN) 5-325 MG per tablet Take 1 tablet by mouth every 6 (six) hours as needed.   Yes Historical Provider, MD  lactulose (CHRONULAC) 10 GM/15ML solution Take 45 mLs (30 g total) by mouth 2 (two) times daily as needed for mild constipation. 12/17/15  Yes Bettey Costa, MD  metoprolol succinate (TOPROL XL) 25 MG 24 hr tablet Take 1 tablet (25 mg total) by mouth daily. 07/18/15  Yes Srikar Sudini, MD  simvastatin (ZOCOR) 20 MG tablet Take 20 mg by mouth daily at 6 PM.   Yes Historical Provider, MD  terazosin (HYTRIN) 5 MG capsule Take 5 mg by mouth at bedtime.   Yes Historical Provider, MD  senna (SENOKOT) 8.6 MG TABS tablet Take 2 tablets (17.2 mg total) by mouth 2 (two) times daily. Patient not taking: Reported on 12/15/2015 05/17/15   Carrie Mew, MD     Results for orders placed or performed during the hospital encounter of 12/19/15 (from the past 48 hour(s))  Basic metabolic panel     Status: Abnormal   Collection Time: 12/19/15  4:40 AM  Result Value Ref Range   Sodium 138 135 - 145 mmol/L   Potassium 3.9 3.5 - 5.1 mmol/L   Chloride 109 101 - 111 mmol/L   CO2 22 22 - 32 mmol/L   Glucose, Bld 166 (H) 65 - 99 mg/dL   BUN  26 (H) 6 - 20 mg/dL   Creatinine, Ser 1.50 (H) 0.61 - 1.24 mg/dL   Calcium 9.3 8.9 - 10.3 mg/dL   GFR calc non Af Amer 38 (L) >60 mL/min   GFR calc Af Amer 43 (L) >60 mL/min    Comment: (NOTE) The eGFR has been calculated using the CKD EPI equation. This calculation has not been validated in all clinical situations. eGFR's persistently <60 mL/min signify possible Chronic Kidney Disease.    Anion gap 7 5 - 15  CBC     Status: Abnormal   Collection Time: 12/19/15  4:40 AM  Result Value Ref Range   WBC 6.9 3.8 - 10.6 K/uL   RBC 3.01 (L) 4.40 - 5.90 MIL/uL   Hemoglobin 8.7 (L) 13.0 - 18.0 g/dL   HCT 26.2 (L) 40.0 - 52.0  %   MCV 87.1 80.0 - 100.0 fL   MCH 28.9 26.0 - 34.0 pg   MCHC 33.2 32.0 - 36.0 g/dL   RDW 20.4 (H) 11.5 - 14.5 %   Platelets 171 150 - 440 K/uL  Troponin I     Status: None   Collection Time: 12/19/15  4:40 AM  Result Value Ref Range   Troponin I <0.03 <0.031 ng/mL    Comment:        NO INDICATION OF MYOCARDIAL INJURY.    Dg Chest Port 1 View  12/19/2015  CLINICAL DATA:  Acute onset of left-sided chest pain, radiating to the left side of the back. Rectal bleeding. Initial encounter. EXAM: PORTABLE CHEST 1 VIEW COMPARISON:  Chest radiograph performed 12/15/2015 FINDINGS: The lungs are well-aerated. Minimal bilateral atelectasis is noted. There is no evidence of pleural effusion or pneumothorax. The cardiomediastinal silhouette is borderline normal in size. Right paratracheal prominence is relatively stable in appearance and likely reflects normal vasculature. No acute osseous abnormalities are seen. IMPRESSION: Minimal bilateral atelectasis noted.  Lungs otherwise clear. Electronically Signed   By: Garald Balding M.D.   On: 12/19/2015 04:31    Review of Systems  Constitutional: Negative for fever and chills.  HENT: Negative for sore throat and tinnitus.   Eyes: Negative for blurred vision and redness.  Respiratory: Negative for cough and shortness of breath.   Cardiovascular: Positive for chest pain. Negative for palpitations, orthopnea and PND.  Gastrointestinal: Negative for nausea, vomiting, abdominal pain and diarrhea.  Genitourinary: Negative for dysuria, urgency and frequency.  Musculoskeletal: Negative for myalgias and joint pain.  Skin: Negative for rash.       No lesions  Neurological: Negative for speech change, focal weakness and weakness.  Endo/Heme/Allergies: Does not bruise/bleed easily.       No temperature intolerance  Psychiatric/Behavioral: Negative for depression and suicidal ideas.    Blood pressure 147/57, pulse 65, temperature 98.2 F (36.8 C), temperature  source Oral, resp. rate 24, SpO2 100 %. Physical Exam  Nursing note and vitals reviewed. Constitutional: He is oriented to person, place, and time. He appears well-developed and well-nourished. No distress.  HENT:  Head: Normocephalic and atraumatic.  Mouth/Throat: Oropharynx is clear and moist.  Eyes: Conjunctivae and EOM are normal. Pupils are equal, round, and reactive to light. No scleral icterus.  Neck: Normal range of motion. Neck supple. No tracheal deviation present. No thyromegaly present.  Cardiovascular: Normal rate and regular rhythm.  Exam reveals no gallop and no friction rub.   Murmur heard.  Decrescendo systolic murmur is present with a grade of 3/6  Heard best over the pulmonary valve area  Respiratory: Effort normal and breath sounds normal.  GI: Soft. Bowel sounds are normal. He exhibits no distension. There is no tenderness.  Genitourinary:  Deferred  Musculoskeletal: Normal range of motion. He exhibits no edema.  Neurological: He is alert and oriented to person, place, and time. No cranial nerve deficit.  Skin: Skin is warm and dry. No rash noted. No erythema.  Psychiatric: He has a normal mood and affect. His behavior is normal. Judgment and thought content normal.     Assessment/Plan This is a 82 year old African-American male admitted for chest pain. 1. Chest pain: Atypical; possibly anginal due to exertion during difficult micturition secondary to BPH. I placed Nitropaste on the patient's chest. We will follow his cardiac biomarkers and monitor telemetry. Cardiology consult placed. Check TSH and hemoglobin A1c. 2. Essential hypertension: Continue metoprolol and hydralazine 3. BPH: Continue terazosin and Propecia 4. Constipation: Continue lactulose 5. DVT prophylaxis: Heparin 6. GI prophylaxis: None as the patient is not critically ill The patient is a full code. Time spent on admission orders and patient care approximately 45 minutes  Harrie Foreman 12/19/2015, 6:37 AM

## 2015-12-19 NOTE — ED Notes (Signed)
Per EMS patient c/o left chest pain radiating to left side of back that is sharp and constant.  EMS said he rated pain 1/10 but during triage he stated 7/10.  He was seen at [facility] and discharged on 12/17/15 for rectal bleeding.  Patient is not in distress presently, and is throwing random PVC's on the ECG.

## 2015-12-19 NOTE — Care Management (Signed)
Patient presents from [facility].  Facility administration has asked that [facility] follow patient while in the hospital in the event he needs home health services at discharge

## 2015-12-19 NOTE — Progress Notes (Signed)
Patient c/o chest pain and NTG 0.5 mg scheduled was administered. On rechecked, patient stated his chest pain was 7/10 on the pain scale. BP after the NTG was 90/50. Patient has Morphine PRN but because of her BP the RN could not administered it. Dr. Lavetta Nielsen notified with a new order for Oxycodone 5 mg every 4 hours PRN for severe pain. Patient had large amount of GI bleed at this time and that was also reported to Dr. Lavetta Nielsen. New order for CBC put in for 5 AM by DR. Hower. Will continue to monitor.

## 2015-12-19 NOTE — Progress Notes (Signed)
Report given to Manuela Schwartz RN at this time, pt still off the unit in [place]

## 2015-12-19 NOTE — ED Notes (Addendum)
Unsuccessful IV start x2, asking [place] for help.

## 2015-12-19 NOTE — Progress Notes (Signed)
*  PRELIMINARY RESULTS* Echocardiogram 2D Echocardiogram has been performed.  [place] 12/19/2015, 5:28 PM

## 2015-12-19 NOTE — Consult Note (Signed)
[facility] Consultation Note  Patient ID: Victor Dillon, MRN: [id number], DOB/AGE: 1917/01/27 82 y.o. Admit date: 12/19/2015   Date of Consult: 12/19/2015 Primary Physician: No PCP Per Patient Primary Cardiologist: None  Chief Complaint:  Chief Complaint  Patient presents with  . Chest Pain   Reason for Consult: chest. Pain with significant anemia and chronic kidney disease  HPI: 82 y.o. male with essential hypertension and apparent mixed hyperlipidemia with acute onset severe substernal chest discomfort awakening him from sleep. This was worsened when he lied down after getting up to urinate and it radiating into his left chest and into his arm. He was squeezing in nature and the denied associated with shortness of breath nausea vomiting diaphoresis when seen in the emergency room the patient had some improvements but no evidence of improvement with nitroglycerin. Since then the patient does have chronic kidney disease stage III with a GFR of 43 and significant anemia possibly associated with his current issues. Currently the patient does have some improvements no evidence of EKG changes consistent with acute coronary syndrome Past Medical History  Diagnosis Date  . Hypertension       Surgical History:  Past Surgical History  Procedure Laterality Date  . Appendectomy       Home Meds: Prior to Admission medications   Medication Sig Start Date End Date Taking? Authorizing Provider  acetaminophen (TYLENOL) 325 MG tablet Take 650 mg by mouth every 4 (four) hours as needed.   Yes Historical Provider, MD  calcium carbonate (CALCIUM 600) 600 MG TABS tablet Take 600 mg by mouth 2 (two) times daily.   Yes Historical Provider, MD  Cholecalciferol 10000 units TABS Take 1 tablet by mouth daily.   Yes Historical Provider, MD  docusate sodium (COLACE) 100 MG capsule Take 2 capsules (200 mg total) by mouth 2 (two) times daily. 05/17/15  Yes Carrie Mew, MD  finasteride  (PROPECIA) 1 MG tablet Take 1 mg by mouth daily.    Yes Historical Provider, MD  hydrALAZINE (APRESOLINE) 50 MG tablet Take 50 mg by mouth 3 (three) times daily.   Yes Historical Provider, MD  HYDROcodone-acetaminophen (NORCO/VICODIN) 5-325 MG per tablet Take 1 tablet by mouth every 6 (six) hours as needed.   Yes Historical Provider, MD  lactulose (CHRONULAC) 10 GM/15ML solution Take 45 mLs (30 g total) by mouth 2 (two) times daily as needed for mild constipation. 12/17/15  Yes Bettey Costa, MD  metoprolol succinate (TOPROL XL) 25 MG 24 hr tablet Take 1 tablet (25 mg total) by mouth daily. 07/18/15  Yes Srikar Sudini, MD  simvastatin (ZOCOR) 20 MG tablet Take 20 mg by mouth daily at 6 PM.   Yes Historical Provider, MD  terazosin (HYTRIN) 5 MG capsule Take 5 mg by mouth at bedtime.   Yes Historical Provider, MD  senna (SENOKOT) 8.6 MG TABS tablet Take 2 tablets (17.2 mg total) by mouth 2 (two) times daily. Patient not taking: Reported on 12/15/2015 05/17/15   Carrie Mew, MD    Inpatient Medications:  . aspirin EC  81 mg Oral Daily  . calcium carbonate  1,000 mg Oral BID  . cholecalciferol  1,000 Units Oral Daily  . docusate sodium  200 mg Oral BID  . hydrALAZINE  50 mg Oral TID  . metoprolol succinate  25 mg Oral Daily  . nitroGLYCERIN  0.5 inch Topical 4 times per day  . senna  2 tablet Oral BID  . simvastatin  20 mg Oral q1800  .  sodium chloride flush  3 mL Intravenous Q12H  . terazosin  5 mg Oral QHS   . sodium chloride 100 mL/hr at 12/19/15 [id number]    Allergies: No Known Allergies  Social History   Social History  . Marital Status: Single    Spouse Name: N/A  . Number of Children: N/A  . Years of Education: N/A   Occupational History  . Not on file.   Social History Main Topics  . Smoking status: Former Research scientist (life sciences)  . Smokeless tobacco: Not on file  . Alcohol Use: No  . Drug Use: No  . Sexual Activity: Not on file   Other Topics Concern  . Not on file   Social History  Narrative     History reviewed. No pertinent family history.   Review of Systems Positive for chest and abdominal discomfort Negative for: General:  chills, fever, night sweats or weight changes.  Cardiovascular: PND orthopnea syncope dizziness  Dermatological skin lesions rashes Respiratory: Cough congestion Urologic: Frequent urination urination at night and hematuria Abdominal: negative for nausea, vomiting, diarrhea, bright red blood per rectum, melena, or hematemesis Neurologic: negative for visual changes, and/or hearing changes  All other systems reviewed and are otherwise negative except as noted above.  Labs:  Recent Labs  12/19/15 0440  TROPONINI <0.03   Lab Results  Component Value Date   WBC 6.9 12/19/2015   HGB 7.5* 12/19/2015   HCT 22.9* 12/19/2015   MCV 86.3 12/19/2015   PLT 179 12/19/2015    Recent Labs Lab 12/15/15 0910  12/19/15 0440  NA 143  < > 138  K 4.0  < > 3.9  CL 112*  < > 109  CO2 24  < > 22  BUN 24*  < > 26*  CREATININE 1.32*  < > 1.50*  CALCIUM 9.4  < > 9.3  PROT 7.1  --   --   BILITOT 0.6  --   --   ALKPHOS 51  --   --   ALT 11*  --   --   AST 20  --   --   GLUCOSE 121*  < > 166*  < > = values in this interval not displayed. Lab Results  Component Value Date   CHOL 152 10/27/2013   HDL 46 10/27/2013   LDLCALC 88 10/27/2013   TRIG 90 10/27/2013   No results found for: DDIMER  Radiology/Studies:  Nm Gi Blood Loss  12/19/2015  CLINICAL DATA:  GI bleed, last bleeding 11:45 a.m. today EXAM: NUCLEAR MEDICINE GASTROINTESTINAL BLEEDING SCAN TECHNIQUE: Sequential abdominal images were obtained following intravenous administration of Tc-69m labeled red blood cells. RADIOPHARMACEUTICALS:  22.82 mCi Tc-76m in-vitro labeled red cells. COMPARISON:  None. FINDINGS: There is no abnormal radiotracer accumulation to suggest active GI bleed or localize the GI bleed. Normal activity seen within the blood pool, bladder and perineum. IMPRESSION: No  active GI bleeding detected. Electronically Signed   By: Rolm Baptise M.D.   On: 12/19/2015 16:54   Dg Chest Port 1 View  12/19/2015  CLINICAL DATA:  Acute onset of left-sided chest pain, radiating to the left side of the back. Rectal bleeding. Initial encounter. EXAM: PORTABLE CHEST 1 VIEW COMPARISON:  Chest radiograph performed 12/15/2015 FINDINGS: The lungs are well-aerated. Minimal bilateral atelectasis is noted. There is no evidence of pleural effusion or pneumothorax. The cardiomediastinal silhouette is borderline normal in size. Right paratracheal prominence is relatively stable in appearance and likely reflects normal vasculature. No acute osseous abnormalities are  seen. IMPRESSION: Minimal bilateral atelectasis noted.  Lungs otherwise clear. Electronically Signed   By: Garald Balding M.D.   On: 12/19/2015 04:31   Dg Abd Acute W/chest  12/15/2015  CLINICAL DATA:  Rectal bleeding, abdominal cramping and pain. EXAM: DG ABDOMEN ACUTE W/ 1V CHEST COMPARISON:  05/17/2015 FINDINGS: Heart is mildly enlarged. Lungs are clear. No effusions. No acute bony abnormality. Moderate stool burden throughout the colon. Gas throughout nondistended large and small bowel. No evidence of bowel obstruction. No free air organomegaly. No suspicious calcification. No acute bony abnormality. IMPRESSION: Moderate stool burden throughout the colon. No evidence of bowel obstruction or free air. No active cardiopulmonary disease.  Mild cardiomegaly. Electronically Signed   By: Rolm Baptise M.D.   On: 12/15/2015 09:52    EKG: Normal sinus rhythm  Weights: Filed Weights   12/19/15 1211  Weight: 139 lb 4.8 oz (63.186 kg)     Physical Exam: Blood pressure 133/72, pulse 70, temperature 97.9 F (36.6 C), temperature source Oral, resp. rate 18, height 5\' 8"  (1.727 m), weight 139 lb 4.8 oz (63.186 kg), SpO2 100 %. Body mass index is 21.19 kg/(m^2). General: Well developed, well nourished, in no acute distress. Head eyes ears  nose throat: Normocephalic, atraumatic, sclera non-icteric, no xanthomas, nares are without discharge. No apparent thyromegaly and/or mass  Lungs: Normal respiratory effort.  no wheezes, no rales, no rhonchi.  Heart: RRR with normal S1 S2. no murmur gallop, no rub, PMI is normal size and placement, carotid upstroke normal without bruit, jugular venous pressure is normal Abdomen: Soft,  -tender, non-distended with normoactive bowel sounds. No hepatomegaly. No rebound/guarding. No obvious abdominal masses. Abdominal aorta is normal size without bruit Extremities: No edema. no cyanosis, no clubbing, no ulcers  Peripheral : 2+ bilateral upper extremity pulses, 2+ bilateral femoral pulses, 2+ bilateral dorsal pedal pulse Neuro: Alert and oriented. No facial asymmetry. No focal deficit. Moves all extremities spontaneously. Musculoskeletal: Normal muscle tone without kyphosis Psych:  Responds to questions appropriately with a normal affect.    Assessment: 82 year old male with some cardiovascular risk factors including essential hypertension mixed hyperlipidemia with the chest discomfort and normal troponin and no current evidence of acute myocardial infarction  Plan: 1. Continue observation of chest discomfort and serial ECG and enzymes X 2. No use of aspirin or other heparin due to concerns of anemia and GI bleed 3. Beta blocker if able with blood pressure and heart rate control 4. Echocardiogram for LV systolic dysfunction valvular heart disease contributing to above 5. Her the treatment options after above  Signed, Corey Skains M.D. [facility] 12/19/2015, 5:48 PM

## 2015-12-20 ENCOUNTER — Inpatient Hospital Stay: Payer: Medicare Other

## 2015-12-20 ENCOUNTER — Encounter: Payer: Self-pay | Admitting: Radiology

## 2015-12-20 DIAGNOSIS — Z7982 Long term (current) use of aspirin: Secondary | ICD-10-CM | POA: Diagnosis not present

## 2015-12-20 DIAGNOSIS — Z79899 Other long term (current) drug therapy: Secondary | ICD-10-CM | POA: Diagnosis not present

## 2015-12-20 DIAGNOSIS — K5909 Other constipation: Secondary | ICD-10-CM | POA: Diagnosis present

## 2015-12-20 DIAGNOSIS — D62 Acute posthemorrhagic anemia: Secondary | ICD-10-CM | POA: Diagnosis present

## 2015-12-20 DIAGNOSIS — R079 Chest pain, unspecified: Secondary | ICD-10-CM | POA: Diagnosis present

## 2015-12-20 DIAGNOSIS — I351 Nonrheumatic aortic (valve) insufficiency: Secondary | ICD-10-CM | POA: Diagnosis present

## 2015-12-20 DIAGNOSIS — N400 Enlarged prostate without lower urinary tract symptoms: Secondary | ICD-10-CM | POA: Diagnosis present

## 2015-12-20 DIAGNOSIS — I248 Other forms of acute ischemic heart disease: Secondary | ICD-10-CM | POA: Diagnosis present

## 2015-12-20 DIAGNOSIS — I350 Nonrheumatic aortic (valve) stenosis: Secondary | ICD-10-CM | POA: Diagnosis present

## 2015-12-20 DIAGNOSIS — K922 Gastrointestinal hemorrhage, unspecified: Secondary | ICD-10-CM | POA: Diagnosis present

## 2015-12-20 DIAGNOSIS — Z66 Do not resuscitate: Secondary | ICD-10-CM | POA: Diagnosis present

## 2015-12-20 DIAGNOSIS — K5791 Diverticulosis of intestine, part unspecified, without perforation or abscess with bleeding: Secondary | ICD-10-CM | POA: Diagnosis present

## 2015-12-20 DIAGNOSIS — I129 Hypertensive chronic kidney disease with stage 1 through stage 4 chronic kidney disease, or unspecified chronic kidney disease: Secondary | ICD-10-CM | POA: Diagnosis present

## 2015-12-20 DIAGNOSIS — N183 Chronic kidney disease, stage 3 (moderate): Secondary | ICD-10-CM | POA: Diagnosis present

## 2015-12-20 DIAGNOSIS — E782 Mixed hyperlipidemia: Secondary | ICD-10-CM | POA: Diagnosis present

## 2015-12-20 DIAGNOSIS — Z87891 Personal history of nicotine dependence: Secondary | ICD-10-CM | POA: Diagnosis not present

## 2015-12-20 LAB — CBC WITH DIFFERENTIAL/PLATELET
BASOS ABS: 0 10*3/uL (ref 0–0.1)
Basophils Relative: 0 %
Eosinophils Absolute: 0.2 10*3/uL (ref 0–0.7)
Eosinophils Relative: 2 %
HEMATOCRIT: 23.7 % — AB (ref 40.0–52.0)
Hemoglobin: 8 g/dL — ABNORMAL LOW (ref 13.0–18.0)
LYMPHS ABS: 1.2 10*3/uL (ref 1.0–3.6)
MCH: 29.3 pg (ref 26.0–34.0)
MCHC: 33.9 g/dL (ref 32.0–36.0)
MCV: 86.3 fL (ref 80.0–100.0)
Monocytes Absolute: 0.5 10*3/uL (ref 0.2–1.0)
Monocytes Relative: 7 %
NEUTROS ABS: 5.8 10*3/uL (ref 1.4–6.5)
Platelets: 150 10*3/uL (ref 150–440)
RBC: 2.75 MIL/uL — ABNORMAL LOW (ref 4.40–5.90)
RDW: 17.3 % — AB (ref 11.5–14.5)
WBC: 7.7 10*3/uL (ref 3.8–10.6)

## 2015-12-20 LAB — CBC
HCT: 18.2 % — ABNORMAL LOW (ref 40.0–52.0)
Hemoglobin: 5.9 g/dL — ABNORMAL LOW (ref 13.0–18.0)
MCH: 28 pg (ref 26.0–34.0)
MCHC: 32.4 g/dL (ref 32.0–36.0)
MCV: 86.5 fL (ref 80.0–100.0)
PLATELETS: 155 10*3/uL (ref 150–440)
RBC: 2.11 MIL/uL — ABNORMAL LOW (ref 4.40–5.90)
RDW: 20.1 % — ABNORMAL HIGH (ref 11.5–14.5)
WBC: 10.3 10*3/uL (ref 3.8–10.6)

## 2015-12-20 LAB — PREPARE RBC (CROSSMATCH)

## 2015-12-20 MED ORDER — IOHEXOL 240 MG/ML SOLN: 50.0000 mL | Freq: Once | INTRAMUSCULAR | Status: DC | PRN

## 2015-12-20 MED ORDER — IOHEXOL 300 MG/ML  SOLN
75.0000 mL | Freq: Once | INTRAMUSCULAR | Status: AC | PRN
  Administered 2015-12-20: 75 mL via INTRAVENOUS

## 2015-12-20 MED ORDER — PANTOPRAZOLE SODIUM 40 MG IV SOLR
40.0000 mg | Freq: Two times a day (BID) | INTRAVENOUS | Status: DC
  Administered 2015-12-20 – 2015-12-22 (×5): 40 mg via INTRAVENOUS
  Filled 2015-12-20 (×5): qty 40

## 2015-12-20 MED ORDER — SODIUM CHLORIDE 0.9 % IV SOLN: Freq: Once | INTRAVENOUS | Status: DC

## 2015-12-20 NOTE — Care Management Obs Status (Signed)
[place] NOTIFICATION   Patient Details  Name: KHAOS LADUCER MRN: [id number] Date of Birth: Oct 26, 1917   Medicare Observation Status Notification Given:  Yes    Amaka Gluth A, RN 12/20/2015, 11:34 AM

## 2015-12-20 NOTE — Progress Notes (Signed)
GI Inpatient Follow-up Note  Patient Identification: Victor Dillon is a 82 y.o. male with rectal bleeding, anemia.   Subjective:  Some more bleeding this am.  Hgb down to 5.9.   Denies abd pain, n/v, f/c, c/p, SOB.   Scheduled Inpatient Medications:  . sodium chloride   Intravenous Once  . aspirin EC  81 mg Oral Daily  . calcium carbonate  1,000 mg Oral BID  . cholecalciferol  1,000 Units Oral Daily  . docusate sodium  200 mg Oral BID  . metoprolol succinate  25 mg Oral Daily  . nitroGLYCERIN  0.5 inch Topical 4 times per day  . pantoprazole (PROTONIX) IV  40 mg Intravenous Q12H  . senna  2 tablet Oral BID  . simvastatin  20 mg Oral q1800  . sodium chloride flush  3 mL Intravenous Q12H  . terazosin  5 mg Oral QHS    Continuous Inpatient Infusions:   . sodium chloride Stopped (12/20/15 1139)    PRN Inpatient Medications:  acetaminophen **OR** acetaminophen, iohexol, lactulose, ondansetron **OR** ondansetron (ZOFRAN) IV, oxyCODONE  Review of Systems: Constitutional: Weight is stable.  Eyes: No changes in vision. ENT: No oral lesions, sore throat.  GI: see HPI.  Heme/Lymph: No easy bruising.  CV: No chest pain.  GU: No hematuria.  Integumentary: No rashes.  Neuro: No headaches.  Psych: No depression/anxiety.  Endocrine: No heat/cold intolerance.  Allergic/Immunologic: No urticaria.  Resp: No cough, SOB.  Musculoskeletal: No joint swelling.    Physical Examination: BP 144/60 mmHg  Pulse 75  Temp(Src) 98.6 F (37 C) (Oral)  Resp 18  Ht 5\' 8"  (1.727 m)  Wt 63.095 kg (139 lb 1.6 oz)  BMI 21.15 kg/m2  SpO2 99% Gen: NAD, alert and oriented x 4, elderly appeaering.  HEENT: PEERLA, EOMI, Neck: supple, no JVD or thyromegaly Chest: CTA bilaterally, no wheezes, crackles, or other adventitious sounds CV: RRR, no m/g/c/r Abd: soft, NT, ND, +BS in all four quadrants; no HSM, guarding, ridigity, or rebound tenderness Ext: no edema, well perfused with 2+ pulses, Skin:  no rash or lesions noted Lymph: no LAD  Data: Lab Results  Component Value Date   WBC 10.3 12/20/2015   HGB 5.9* 12/20/2015   HCT 18.2* 12/20/2015   MCV 86.5 12/20/2015   PLT 155 12/20/2015    Recent Labs Lab 12/19/15 0440 12/19/15 1229 12/20/15 0600  HGB 8.7* 7.5* 5.9*   Lab Results  Component Value Date   NA 138 12/19/2015   K 3.9 12/19/2015   CL 109 12/19/2015   CO2 22 12/19/2015   BUN 26* 12/19/2015   CREATININE 1.50* 12/19/2015   Lab Results  Component Value Date   ALT 11* 12/15/2015   AST 20 12/15/2015   ALKPHOS 51 12/15/2015   BILITOT 0.6 12/15/2015    Recent Labs Lab 12/15/15 0910 12/19/15 0440  APTT 31  --   INR 1.03 1.01   Assessment/Plan: Victor Dillon is a 82 y.o. male with rectal bleeding and anemia.  Hgb down to 5.9. Had bleeding this am but none this afternoon.   Recommendations: - Given advanced age ( 80),  Would give prbc and continue to monitoir Hgb , likely bleeding will stop on its own.  Would be very high risk for colonoscpy and unlikely to change management since this is likey diverticular bleed.     Please call with questions or concerns.  Dee Paden, Grace Blight, MD

## 2015-12-20 NOTE — Progress Notes (Signed)
[place] at [facility]                                                                                                                                                                                            Patient Demographics   Victor Dillon, is a 82 y.o. male, DOB - Jul 26, 1917, [id number]  Admit date - 12/19/2015   Admitting Physician Harrie Foreman, MD  Outpatient Primary MD for the patient is No PCP Per Patient   LOS - 0  Subjective: Patient's hemoglobin has trended down, and its at 5.9. He did have some dark color stools yesterday. Had a bleeding scan that was negative.     Review of Systems:   CONSTITUTIONAL: No documented fever. No fatigue, weakness. No weight gain, no weight loss.  EYES: No blurry or double vision.  ENT: No tinnitus. No postnasal drip. No redness of the oropharynx.  RESPIRATORY: No cough, no wheeze, no hemoptysis. No dyspnea.  CARDIOVASCULAR: Positive chest pain. No orthopnea. No palpitations. No syncope.  GASTROINTESTINAL: No nausea, no vomiting or diarrhea. No abdominal pain. Positive melena, hematochezia.  GENITOURINARY: No dysuria or hematuria.  ENDOCRINE: No polyuria or nocturia. No heat or cold intolerance.  HEMATOLOGY: Positive anemia. No bruising. No bleeding.  INTEGUMENTARY: No rashes. No lesions.  MUSCULOSKELETAL: No arthritis. No swelling. No gout.  NEUROLOGIC: No numbness, tingling, or ataxia. No seizure-type activity.  PSYCHIATRIC: No anxiety. No insomnia. No ADD.    Vitals:   Filed Vitals:   12/19/15 2014 12/19/15 2315 12/19/15 2341 12/20/15 0609  BP: 139/56 102/47 90/50 118/48  Pulse: 68 73 70 73  Temp: 98 F (36.7 C)   98.3 F (36.8 C)  TempSrc: Oral     Resp: 18   18  Height:      Weight:    63.095 kg (139 lb 1.6 oz)  SpO2: 100%  100% 100%    Wt Readings from Last 3 Encounters:  12/20/15 63.095 kg (139 lb 1.6 oz)  12/15/15 65.318 kg (144 lb)  10/14/15 69.854 kg (154 lb)      Intake/Output Summary (Last 24 hours) at 12/20/15 0933 Last data filed at 12/19/15 2018  Gross per 24 hour  Intake      0 ml  Output    225 ml  Net   -225 ml    Physical Exam:   GENERAL: Pleasant-appearing in no apparent distress.  HEAD, EYES, EARS, NOSE AND THROAT: Atraumatic, normocephalic. Extraocular muscles are intact. Pupils equal and reactive to light. Sclerae anicteric. No conjunctival injection. No oro-pharyngeal erythema.  NECK: Supple. There is no jugular venous  distention. No bruits, no lymphadenopathy, no thyromegaly.  HEART: Regular rate and rhythm,. No murmurs, no rubs, no clicks.  LUNGS: Clear to auscultation bilaterally. No rales or rhonchi. No wheezes.  ABDOMEN: Soft, flat, nontender, nondistended. Has good bowel sounds. No hepatosplenomegaly appreciated.  EXTREMITIES: No evidence of any cyanosis, clubbing, or peripheral edema.  +2 pedal and radial pulses bilaterally.  NEUROLOGIC: The patient is alert, awake, and oriented x3 with no focal motor or sensory deficits appreciated bilaterally.  SKIN: Moist and warm with no rashes appreciated.  Psych: Not anxious, depressed LN: No inguinal LN enlargement    Antibiotics   Anti-infectives    None      Medications   Scheduled Meds: . sodium chloride   Intravenous Once  . aspirin EC  81 mg Oral Daily  . calcium carbonate  1,000 mg Oral BID  . cholecalciferol  1,000 Units Oral Daily  . docusate sodium  200 mg Oral BID  . hydrALAZINE  50 mg Oral TID  . metoprolol succinate  25 mg Oral Daily  . nitroGLYCERIN  0.5 inch Topical 4 times per day  . senna  2 tablet Oral BID  . simvastatin  20 mg Oral q1800  . sodium chloride flush  3 mL Intravenous Q12H  . terazosin  5 mg Oral QHS   Continuous Infusions: . sodium chloride 100 mL/hr at 12/19/15 0947   PRN Meds:.acetaminophen **OR** acetaminophen, lactulose, ondansetron **OR** ondansetron (ZOFRAN) IV, oxyCODONE   Data Review:   Micro Results No results  found for this or any previous visit (from the past 240 hour(s)).  Radiology Reports Nm Gi Blood Loss  12/19/2015  CLINICAL DATA:  GI bleed, last bleeding 11:45 a.m. today EXAM: NUCLEAR MEDICINE GASTROINTESTINAL BLEEDING SCAN TECHNIQUE: Sequential abdominal images were obtained following intravenous administration of Tc-9m labeled red blood cells. RADIOPHARMACEUTICALS:  22.82 mCi Tc-61m in-vitro labeled red cells. COMPARISON:  None. FINDINGS: There is no abnormal radiotracer accumulation to suggest active GI bleed or localize the GI bleed. Normal activity seen within the blood pool, bladder and perineum. IMPRESSION: No active GI bleeding detected. Electronically Signed   By: Rolm Baptise M.D.   On: 12/19/2015 16:54   Dg Chest Port 1 View  12/19/2015  CLINICAL DATA:  Acute onset of left-sided chest pain, radiating to the left side of the back. Rectal bleeding. Initial encounter. EXAM: PORTABLE CHEST 1 VIEW COMPARISON:  Chest radiograph performed 12/15/2015 FINDINGS: The lungs are well-aerated. Minimal bilateral atelectasis is noted. There is no evidence of pleural effusion or pneumothorax. The cardiomediastinal silhouette is borderline normal in size. Right paratracheal prominence is relatively stable in appearance and likely reflects normal vasculature. No acute osseous abnormalities are seen. IMPRESSION: Minimal bilateral atelectasis noted.  Lungs otherwise clear. Electronically Signed   By: Garald Balding M.D.   On: 12/19/2015 04:31   Dg Abd Acute W/chest  12/15/2015  CLINICAL DATA:  Rectal bleeding, abdominal cramping and pain. EXAM: DG ABDOMEN ACUTE W/ 1V CHEST COMPARISON:  05/17/2015 FINDINGS: Heart is mildly enlarged. Lungs are clear. No effusions. No acute bony abnormality. Moderate stool burden throughout the colon. Gas throughout nondistended large and small bowel. No evidence of bowel obstruction. No free air organomegaly. No suspicious calcification. No acute bony abnormality. IMPRESSION:  Moderate stool burden throughout the colon. No evidence of bowel obstruction or free air. No active cardiopulmonary disease.  Mild cardiomegaly. Electronically Signed   By: Rolm Baptise M.D.   On: 12/15/2015 09:52     CBC  Recent Labs  Lab 12/15/15 0910  12/16/15 0540 12/16/15 1017 12/17/15 0542 12/19/15 0440 12/19/15 1229 12/20/15 0600  WBC 6.1  --  5.0  --  6.1 6.9 6.9 10.3  HGB 10.9*  < > 8.9* 9.1* 8.2* 8.7* 7.5* 5.9*  HCT 33.5*  --  27.4*  --  25.4* 26.2* 22.9* 18.2*  PLT 221  --  175  --  163 171 179 155  MCV 85.5  --  86.0  --  86.1 87.1 86.3 86.5  MCH 27.8  --  28.0  --  27.9 28.9 28.3 28.0  MCHC 32.5  --  32.6  --  32.3 33.2 32.8 32.4  RDW 21.8*  --  21.6*  --  20.5* 20.4* 21.0* 20.1*  LYMPHSABS 1.6  --   --   --   --   --  1.6  --   MONOABS 0.4  --   --   --   --   --  0.5  --   EOSABS 0.2  --   --   --   --   --  0.1  --   BASOSABS 0.0  --   --   --   --   --  0.0  --   < > = values in this interval not displayed.  Chemistries   Recent Labs Lab 12/15/15 0910 12/17/15 0542 12/17/15 1248 12/19/15 0440  NA 143 136 140 138  K 4.0 3.8 3.9 3.9  CL 112* 108 110 109  CO2 24 20* 20* 22  GLUCOSE 121* 100* 124* 166*  BUN 24* 30* 30* 26*  CREATININE 1.32* 2.15* 1.83* 1.50*  CALCIUM 9.4 8.7* 9.0 9.3  AST 20  --   --   --   ALT 11*  --   --   --   ALKPHOS 51  --   --   --   BILITOT 0.6  --   --   --    ------------------------------------------------------------------------------------------------------------------ estimated creatinine clearance is 25.7 mL/min (by C-G formula based on Cr of 1.5). ------------------------------------------------------------------------------------------------------------------  Recent Labs  12/19/15 0440  HGBA1C 5.6   ------------------------------------------------------------------------------------------------------------------ No results for input(s): CHOL, HDL, LDLCALC, TRIG, CHOLHDL, LDLDIRECT in the last 72  hours. ------------------------------------------------------------------------------------------------------------------  Recent Labs  12/19/15 0440  TSH 0.616   ------------------------------------------------------------------------------------------------------------------ No results for input(s): VITAMINB12, FOLATE, FERRITIN, TIBC, IRON, RETICCTPCT in the last 72 hours.  Coagulation profile  Recent Labs Lab 12/15/15 0910 12/19/15 0440  INR 1.03 1.01    No results for input(s): DDIMER in the last 72 hours.  Cardiac Enzymes  Recent Labs Lab 12/19/15 0440  TROPONINI <0.03   ------------------------------------------------------------------------------------------------------------------ Invalid input(s): POCBNP    Assessment & Plan   This is a 82 year old African-American male admitted for chest pain. 1. Chest pain: Atypical; due to symptomatic anemia, at this point we'll transfused him. His troponin remained negative 2. Acute blood loss anemia due to likely lower GI bleed- I have discussed the case with patient and his healthcare power of attorney Glendell Docker , they're agreeable, risk and benefits explained to transfusion they're agreeable to transfusion. I will also obtain a CT scan of the abdomen to see any obvious mass to explain the reason for his bleeding.   3 Essential hypertension: Discontinue hydralazine in light of blood pressure being low last night 4. BPH: Continue terazosin and Propecia 4. Constipation: Continue lactulose 5. DVT prophylaxis: SCDs 6. GI prophylaxis: Due to GI bleed I will place him on Protonix   CODE STATUS discussed with  the patient and his healthcare power of attorney Glendell Docker the area in agreement to make patient DO NOT RESUSCITATE which is very appropriate for this 82 year old male     Code Status Orders        Start     Ordered   12/19/15 1229  Full code   Continuous     12/19/15 1228    Code Status History    Date Active Date  Inactive Code Status Order ID Comments User Context   12/15/2015  2:50 PM 12/17/2015  6:34 PM Full Code [id number]  Idalia Needle, RN Inpatient   07/17/2015  8:07 PM 07/18/2015  9:28 PM Full Code [id number]  Epifanio Lesches, MD ED    Advance Directive Documentation        Most Recent Value   Type of Advance Directive  Healthcare Power of [place], Living will   Pre-existing out of facility DNR order (yellow form or pink MOST form)     "MOST" Form in Place?             Consults GI cardiology DVT Prophylaxis  SCDs  Lab Results  Component Value Date   PLT 155 12/20/2015     Time Spent in minutes  55min  Dustin Flock M.D on 12/20/2015 at 9:33 AM  Between 7am to 6pm - Pager - [phone]  After 6pm go to [email or web address] - password [email or web address] [facility]   Office  [phone]

## 2015-12-20 NOTE — Progress Notes (Signed)
[facility] Encounter Note  Patient: Victor Dillon / Admit Date: 12/19/2015 / Date of Encounter: 12/20/2015, 9:07 AM   Subjective: Patient has no evidence of chest discomfort today. Chest discomfort resolved and hemodynamically stable  Review of Systems: Positive for: Shortness of breath Negative for: Vision change, hearing change, syncope, dizziness, nausea, vomiting,diarrhea, bloody stool, stomach pain, cough, congestion, diaphoresis, urinary frequency, urinary pain,skin lesions, skin rashes Others previously listed  Objective: Telemetry: Normal sinus rhythm Physical Exam: Blood pressure 118/48, pulse 73, temperature 98.3 F (36.8 C), temperature source Oral, resp. rate 18, height 5\' 8"  (1.727 m), weight 139 lb 1.6 oz (63.095 kg), SpO2 100 %. Body mass index is 21.15 kg/(m^2). General: Well developed, well nourished, in no acute distress. Head: Normocephalic, atraumatic, sclera non-icteric, no xanthomas, nares are without discharge. Neck: No apparent masses Lungs: Normal respirations with few wheezes, no rhonchi, no rales , no crackles   Heart: Regular rate and rhythm, normal S1 soft S2, 3+ aortic murmur, no rub, no gallop, PMI is normal size and placement, carotid upstroke normal with bruit, jugular venous pressure normal Abdomen: Soft, non-tender, non-distended with normoactive bowel sounds. No hepatosplenomegaly. Abdominal aorta is normal size without bruit Extremities: Trace edema, no clubbing, no cyanosis, no ulcers,  Peripheral: 2+ radial, 2+ femoral, 1 + dorsal pedal pulses Neuro: Alert and oriented. Moves all extremities spontaneously. Psych:  Responds to questions appropriately with a normal affect.   Intake/Output Summary (Last 24 hours) at 12/20/15 0907 Last data filed at 12/19/15 2018  Gross per 24 hour  Intake      0 ml  Output    225 ml  Net   -225 ml    Inpatient Medications:  . aspirin EC  81 mg Oral Daily  . calcium carbonate  1,000 mg  Oral BID  . cholecalciferol  1,000 Units Oral Daily  . docusate sodium  200 mg Oral BID  . hydrALAZINE  50 mg Oral TID  . metoprolol succinate  25 mg Oral Daily  . nitroGLYCERIN  0.5 inch Topical 4 times per day  . senna  2 tablet Oral BID  . simvastatin  20 mg Oral q1800  . sodium chloride flush  3 mL Intravenous Q12H  . terazosin  5 mg Oral QHS   Infusions:  . sodium chloride 100 mL/hr at 12/19/15 0947    Labs:  Recent Labs  12/17/15 1248 12/19/15 0440  NA 140 138  K 3.9 3.9  CL 110 109  CO2 20* 22  GLUCOSE 124* 166*  BUN 30* 26*  CREATININE 1.83* 1.50*  CALCIUM 9.0 9.3   No results for input(s): AST, ALT, ALKPHOS, BILITOT, PROT, ALBUMIN in the last 72 hours.  Recent Labs  12/19/15 1229 12/20/15 0600  WBC 6.9 10.3  NEUTROABS 4.7  --   HGB 7.5* 5.9*  HCT 22.9* 18.2*  MCV 86.3 86.5  PLT 179 155    Recent Labs  12/19/15 0440  TROPONINI <0.03   Invalid input(s): POCBNP  Recent Labs  12/19/15 0440  HGBA1C 5.6     Weights: Filed Weights   12/19/15 1211 12/20/15 0609  Weight: 139 lb 4.8 oz (63.186 kg) 139 lb 1.6 oz (63.095 kg)     Radiology/Studies:  Nm Gi Blood Loss  12/19/2015  CLINICAL DATA:  GI bleed, last bleeding 11:45 a.m. today EXAM: NUCLEAR MEDICINE GASTROINTESTINAL BLEEDING SCAN TECHNIQUE: Sequential abdominal images were obtained following intravenous administration of Tc-15m labeled red blood cells. RADIOPHARMACEUTICALS:  22.82 mCi Tc-46m in-vitro  labeled red cells. COMPARISON:  None. FINDINGS: There is no abnormal radiotracer accumulation to suggest active GI bleed or localize the GI bleed. Normal activity seen within the blood pool, bladder and perineum. IMPRESSION: No active GI bleeding detected. Electronically Signed   By: Rolm Baptise M.D.   On: 12/19/2015 16:54   Dg Chest Port 1 View  12/19/2015  CLINICAL DATA:  Acute onset of left-sided chest pain, radiating to the left side of the back. Rectal bleeding. Initial encounter. EXAM:  PORTABLE CHEST 1 VIEW COMPARISON:  Chest radiograph performed 12/15/2015 FINDINGS: The lungs are well-aerated. Minimal bilateral atelectasis is noted. There is no evidence of pleural effusion or pneumothorax. The cardiomediastinal silhouette is borderline normal in size. Right paratracheal prominence is relatively stable in appearance and likely reflects normal vasculature. No acute osseous abnormalities are seen. IMPRESSION: Minimal bilateral atelectasis noted.  Lungs otherwise clear. Electronically Signed   By: Garald Balding M.D.   On: 12/19/2015 04:31   Dg Abd Acute W/chest  12/15/2015  CLINICAL DATA:  Rectal bleeding, abdominal cramping and pain. EXAM: DG ABDOMEN ACUTE W/ 1V CHEST COMPARISON:  05/17/2015 FINDINGS: Heart is mildly enlarged. Lungs are clear. No effusions. No acute bony abnormality. Moderate stool burden throughout the colon. Gas throughout nondistended large and small bowel. No evidence of bowel obstruction. No free air organomegaly. No suspicious calcification. No acute bony abnormality. IMPRESSION: Moderate stool burden throughout the colon. No evidence of bowel obstruction or free air. No active cardiopulmonary disease.  Mild cardiomegaly. Electronically Signed   By: Rolm Baptise M.D.   On: 12/15/2015 09:52     Assessment and Recommendation  82 y.o. male with on a kidney disease stage III aortic valve disease and had new onset chest pressure with significant anemia and concerns for GI bleed but no evidence of myocardial infarction at this time with no significant elevation of troponin. EKG has been stable with nonspecific T-wave inversions 1. Further investigation of possible causes of anemia and possible GI bleed which may be related to aortic valve stenosis 2. Echocardiogram for aortic stenosis LV systolic dysfunction causing chest discomfort 3. Begin ambulation and following for other further significant symptoms although no further cardiac diagnostics necessary at this  time  Signed, Serafina Royals M.D. FACC

## 2015-12-21 LAB — BASIC METABOLIC PANEL
Anion gap: 6 (ref 5–15)
BUN: 27 mg/dL — AB (ref 6–20)
CHLORIDE: 118 mmol/L — AB (ref 101–111)
CO2: 19 mmol/L — AB (ref 22–32)
CREATININE: 1.53 mg/dL — AB (ref 0.61–1.24)
Calcium: 8.6 mg/dL — ABNORMAL LOW (ref 8.9–10.3)
GFR calc Af Amer: 42 mL/min — ABNORMAL LOW (ref >60)
GFR calc non Af Amer: 37 mL/min — ABNORMAL LOW (ref >60)
GLUCOSE: 104 mg/dL — AB (ref 65–99)
Potassium: 4.3 mmol/L (ref 3.5–5.1)
SODIUM: 143 mmol/L (ref 135–145)

## 2015-12-21 LAB — CBC
HCT: 28.7 % — ABNORMAL LOW (ref 40.0–52.0)
HEMOGLOBIN: 9.7 g/dL — AB (ref 13.0–18.0)
MCH: 29.7 pg (ref 26.0–34.0)
MCHC: 33.7 g/dL (ref 32.0–36.0)
MCV: 88.1 fL (ref 80.0–100.0)
Platelets: 171 10*3/uL (ref 150–440)
RBC: 3.26 MIL/uL — ABNORMAL LOW (ref 4.40–5.90)
RDW: 17.8 % — ABNORMAL HIGH (ref 11.5–14.5)
WBC: 9.4 10*3/uL (ref 3.8–10.6)

## 2015-12-21 MED ORDER — POLYETHYLENE GLYCOL 3350 17 G PO PACK
17.0000 g | PACK | Freq: Every day | ORAL | Status: DC
  Administered 2015-12-22: 17 g via ORAL
  Filled 2015-12-21: qty 1

## 2015-12-21 NOTE — Discharge Summary (Signed)
Victor Dillon, 82 y.o., DOB Apr 15, 1917, MRN [id number]. Admission date: 12/19/2015 Discharge Date 12/21/2015 Primary MD No PCP Per Patient Admitting Physician Harrie Foreman, MD  Admission Diagnosis  GI bleed [K92.2]  Discharge Diagnosis   Active Problems:   Chest pain . Due to demand ischemia from anemia   Lower GI bleed suspected due to diverticular bleed Chronic constipation  Hypertension BPH        Hospital Course he patient presents emergency department complaining of chest pain. Patient actually was in the hospital recently with a GI bleed. He was seen in the ED and had cardiac enzymes done which were negative his EKG was nonrevealing. However while in the ED patient was noted to have dark color stools. His hemoglobin did trend down from 8.2 on discharge to 7.5. Patient was thought to have symptomatic anemia and was transfused blood. His chest pain resolved. In terms of his bleeding he was seen by cardiology and they did not feel that he was a good candidate for colonoscopy based on his advanced age. He underwent a CT of the abdomen and a bleeding scan. The CT scan showed severe diverticulosis. No evidence of bleeding was noted on the bleeding scan. Patient currently doing well and has no further symptoms. Stable for discharge.  To note, status was discussed with the patient and his healthcare power of attorney and it was decided to make him DO NOT RESUSCITATE.           Consults   gi Significant Tests:  See full reports for all details      Nm Gi Blood Loss  12/19/2015  CLINICAL DATA:  GI bleed, last bleeding 11:45 a.m. today EXAM: NUCLEAR MEDICINE GASTROINTESTINAL BLEEDING SCAN TECHNIQUE: Sequential abdominal images were obtained following intravenous administration of Tc-84m labeled red blood cells. RADIOPHARMACEUTICALS:  22.82 mCi Tc-38m in-vitro labeled red cells. COMPARISON:  None. FINDINGS: There is no abnormal radiotracer accumulation to suggest active GI bleed  or localize the GI bleed. Normal activity seen within the blood pool, bladder and perineum. IMPRESSION: No active GI bleeding detected. Electronically Signed   By: Rolm Baptise M.D.   On: 12/19/2015 16:54   Ct Abdomen Pelvis W Contrast  12/20/2015  CLINICAL DATA:  Acute anemia.  Concern for lower GI bleed. EXAM: CT ABDOMEN AND PELVIS WITH CONTRAST TECHNIQUE: Multidetector CT imaging of the abdomen and pelvis was performed using the standard protocol following bolus administration of intravenous contrast. CONTRAST:  3mL OMNIPAQUE IOHEXOL 300 MG/ML  SOLN The IV infiltrated during the injection which was stopped at 58 mL. Extravasation orders were placed in patient's chart. COMPARISON:  None. FINDINGS: There are trace bilateral pleural effusions. Dependent bibasilar atelectasis. Heart is mildly enlarged. Liver, gallbladder, spleen, pancreas, adrenals are unremarkable. Numerous bilateral renal cysts are noted which cannot be fully characterized on this essentially non contrast study due to infiltration of contrast. Aorta is calcified, non aneurysmal. Extensive sigmoid diverticulosis. No active diverticulitis. Stomach and small bowel are decompressed and grossly unremarkable. No free fluid, free air or adenopathy. Prostate is enlarged measuring 6.9 x 6.1 cm. Urinary bladder grossly unremarkable. No acute bony abnormality or focal bone lesion. IMPRESSION: Extensive sigmoid diverticulosis.  No active diverticulitis. Bilateral renal cystic lesions, most likely benign cysts although these cannot be characterized on this study. Trace bilateral pleural effusions.  Bibasilar atelectasis. Electronically Signed   By: Rolm Baptise M.D.   On: 12/20/2015 15:09   Dg Chest Port 1 View  12/19/2015  CLINICAL DATA:  Acute  onset of left-sided chest pain, radiating to the left side of the back. Rectal bleeding. Initial encounter. EXAM: PORTABLE CHEST 1 VIEW COMPARISON:  Chest radiograph performed 12/15/2015 FINDINGS: The lungs are  well-aerated. Minimal bilateral atelectasis is noted. There is no evidence of pleural effusion or pneumothorax. The cardiomediastinal silhouette is borderline normal in size. Right paratracheal prominence is relatively stable in appearance and likely reflects normal vasculature. No acute osseous abnormalities are seen. IMPRESSION: Minimal bilateral atelectasis noted.  Lungs otherwise clear. Electronically Signed   By: Garald Balding M.D.   On: 12/19/2015 04:31   Dg Abd Acute W/chest  12/15/2015  CLINICAL DATA:  Rectal bleeding, abdominal cramping and pain. EXAM: DG ABDOMEN ACUTE W/ 1V CHEST COMPARISON:  05/17/2015 FINDINGS: Heart is mildly enlarged. Lungs are clear. No effusions. No acute bony abnormality. Moderate stool burden throughout the colon. Gas throughout nondistended large and small bowel. No evidence of bowel obstruction. No free air organomegaly. No suspicious calcification. No acute bony abnormality. IMPRESSION: Moderate stool burden throughout the colon. No evidence of bowel obstruction or free air. No active cardiopulmonary disease.  Mild cardiomegaly. Electronically Signed   By: Rolm Baptise M.D.   On: 12/15/2015 09:52       Today   Subjective:   Victor Dillon feels well denies any complatins  Objective:   Blood pressure 158/66, pulse 64, temperature 98.4 F (36.9 C), temperature source Oral, resp. rate 18, height 5\' 8"  (1.727 m), weight 66.271 kg (146 lb 1.6 oz), SpO2 100 %.  .  Intake/Output Summary (Last 24 hours) at 12/21/15 1346 Last data filed at 12/21/15 1000  Gross per 24 hour  Intake 2552.34 ml  Output    926 ml  Net 1626.34 ml    Exam VITAL SIGNS: Blood pressure 158/66, pulse 64, temperature 98.4 F (36.9 C), temperature source Oral, resp. rate 18, height 5\' 8"  (1.727 m), weight 66.271 kg (146 lb 1.6 oz), SpO2 100 %.  GENERAL:  82 y.o.-year-old patient lying in the bed with no acute distress.  EYES: Pupils equal, round, reactive to light and accommodation. No  scleral icterus. Extraocular muscles intact.  HEENT: Head atraumatic, normocephalic. Oropharynx and nasopharynx clear.  NECK:  Supple, no jugular venous distention. No thyroid enlargement, no tenderness.  LUNGS: Normal breath sounds bilaterally, no wheezing, rales,rhonchi or crepitation. No use of accessory muscles of respiration.  CARDIOVASCULAR: S1, S2 normal. No murmurs, rubs, or gallops.  ABDOMEN: Soft, nontender, nondistended. Bowel sounds present. No organomegaly or mass.  EXTREMITIES: No pedal edema, cyanosis, or clubbing.  NEUROLOGIC: Cranial nerves II through XII are intact. Muscle strength 5/5 in all extremities. Sensation intact. Gait not checked.  PSYCHIATRIC: The patient is alert and oriented x 3.  SKIN: No obvious rash, lesion, or ulcer.   Data Review     CBC w Diff: Lab Results  Component Value Date   WBC 9.4 12/21/2015   WBC 6.6 03/10/2015   HGB 9.7* 12/21/2015   HGB 12.7* 03/10/2015   HCT 28.7* 12/21/2015   HCT 38.9* 03/10/2015   PLT 171 12/21/2015   PLT 206 03/10/2015   LYMPHOPCT 15% 12/20/2015   MONOPCT 7% 12/20/2015   EOSPCT 2% 12/20/2015   BASOPCT 0% 12/20/2015   CMP: Lab Results  Component Value Date   NA 143 12/21/2015   NA 139 03/10/2015   K 4.3 12/21/2015   K 4.0 03/10/2015   CL 118* 12/21/2015   CL 107 03/10/2015   CO2 19* 12/21/2015   CO2 27 03/10/2015  BUN 27* 12/21/2015   BUN 35* 03/10/2015   CREATININE 1.53* 12/21/2015   CREATININE 1.64* 03/10/2015   PROT 7.1 12/15/2015   PROT 7.6 10/26/2013   ALBUMIN 3.6 12/15/2015   ALBUMIN 3.6 10/26/2013   BILITOT 0.6 12/15/2015   BILITOT 0.3 10/26/2013   ALKPHOS 51 12/15/2015   ALKPHOS 80 10/26/2013   AST 20 12/15/2015   AST 28 10/26/2013   ALT 11* 12/15/2015   ALT 22 10/26/2013  .  Micro Results No results found for this or any previous visit (from the past 240 hour(s)).      Code Status Orders        Start     Ordered   12/20/15 0933  Do not attempt resuscitation (DNR)    Continuous    Question Answer Comment  In the event of cardiac or respiratory ARREST Do not call a "code blue"   In the event of cardiac or respiratory ARREST Do not perform Intubation, CPR, defibrillation or ACLS   In the event of cardiac or respiratory ARREST Use medication by any route, position, wound care, and other measures to relive pain and suffering. May use oxygen, suction and manual treatment of airway obstruction as needed for comfort.      12/20/15 0932    Code Status History    Date Active Date Inactive Code Status Order ID Comments User Context   12/19/2015 12:28 PM 12/20/2015  9:32 AM Full Code [id number]  Harrie Foreman, MD Inpatient   12/15/2015  2:50 PM 12/17/2015  6:34 PM Full Code [id number]  Idalia Needle, RN Inpatient   07/17/2015  8:07 PM 07/18/2015  9:28 PM Full Code [id number]  Epifanio Lesches, MD ED    Advance Directive Documentation        Most Recent Value   Type of Advance Directive  Healthcare Power of Attorney, Living will   Pre-existing out of facility DNR order (yellow form or pink MOST form)     "MOST" Form in Place?            Follow-up Information    Follow up with pcp In 5 days.      Discharge Medications     Medication List    TAKE these medications        acetaminophen 325 MG tablet  Commonly known as:  TYLENOL  Take 650 mg by mouth every 4 (four) hours as needed.     CALCIUM 600 600 MG Tabs tablet  Generic drug:  calcium carbonate  Take 600 mg by mouth 2 (two) times daily.     Cholecalciferol 10000 units Tabs  Take 1 tablet by mouth daily.     docusate sodium 100 MG capsule  Commonly known as:  COLACE  Take 2 capsules (200 mg total) by mouth 2 (two) times daily.     finasteride 1 MG tablet  Commonly known as:  PROPECIA  Take 1 mg by mouth daily.     hydrALAZINE 50 MG tablet  Commonly known as:  APRESOLINE  Take 50 mg by mouth 3 (three) times daily.     HYDROcodone-acetaminophen 5-325 MG tablet  Commonly known  as:  NORCO/VICODIN  Take 1 tablet by mouth every 6 (six) hours as needed.     lactulose 10 GM/15ML solution  Commonly known as:  CHRONULAC  Take 45 mLs (30 g total) by mouth 2 (two) times daily as needed for mild constipation.     metoprolol succinate 25 MG 24 hr tablet  Commonly known as:  TOPROL XL  Take 1 tablet (25 mg total) by mouth daily.     senna 8.6 MG Tabs tablet  Commonly known as:  SENOKOT  Take 2 tablets (17.2 mg total) by mouth 2 (two) times daily.     simvastatin 20 MG tablet  Commonly known as:  ZOCOR  Take 20 mg by mouth daily at 6 PM.     terazosin 5 MG capsule  Commonly known as:  HYTRIN  Take 5 mg by mouth at bedtime.           Total Time in preparing paper work, data evaluation and todays exam - 35 minutes  Dustin Flock M.D on 12/21/2015 at 1:46 PM  [facility]   Office  [phone]

## 2015-12-21 NOTE — Progress Notes (Signed)
[facility] Encounter Note  Patient: Victor Dillon / Admit Date: 12/19/2015 / Date of Encounter: 12/21/2015, 6:37 AM   Subjective: No evidence of symptoms of chest discomfort or shortness of breath. Patient is more hemodynamically stable at this time and no bleeding complications  Review of Systems: Positive for: Shortness of breath Negative for: Vision change, hearing change, syncope, dizziness, nausea, vomiting,diarrhea, bloody stool, stomach pain, cough, congestion, diaphoresis, urinary frequency, urinary pain,skin lesions, skin rashes Others previously listed  Objective: Telemetry: Normal sinus rhythm Physical Exam: Blood pressure 188/66, pulse 73, temperature 98.6 F (37 C), temperature source Oral, resp. rate 18, height 5\' 8"  (1.727 m), weight 146 lb 1.6 oz (66.271 kg), SpO2 97 %. Body mass index is 22.22 kg/(m^2). General: Well developed, well nourished, in no acute distress. Head: Normocephalic, atraumatic, sclera non-icteric, no xanthomas, nares are without discharge. Neck: No apparent masses Lungs: Normal respirations with few wheezes, no rhonchi, no rales , no crackles   Heart: Regular rate and rhythm, normal S1 soft S2, 3+ aortic murmur, no rub, no gallop, PMI is normal size and placement, carotid upstroke normal with bruit, jugular venous pressure normal Abdomen: Soft, non-tender, non-distended with normoactive bowel sounds. No hepatosplenomegaly. Abdominal aorta is normal size without bruit Extremities: Trace edema, no clubbing, no cyanosis, no ulcers,  Peripheral: 2+ radial, 2+ femoral, 1 + dorsal pedal pulses Neuro: Alert and oriented. Moves all extremities spontaneously. Psych:  Responds to questions appropriately with a normal affect.   Intake/Output Summary (Last 24 hours) at 12/21/15 0637 Last data filed at 12/21/15 0448  Gross per 24 hour  Intake 1352.34 ml  Output    751 ml  Net 601.34 ml    Inpatient Medications:  . sodium chloride    Intravenous Once  . aspirin EC  81 mg Oral Daily  . calcium carbonate  1,000 mg Oral BID  . cholecalciferol  1,000 Units Oral Daily  . docusate sodium  200 mg Oral BID  . metoprolol succinate  25 mg Oral Daily  . nitroGLYCERIN  0.5 inch Topical 4 times per day  . pantoprazole (PROTONIX) IV  40 mg Intravenous Q12H  . senna  2 tablet Oral BID  . simvastatin  20 mg Oral q1800  . sodium chloride flush  3 mL Intravenous Q12H  . terazosin  5 mg Oral QHS   Infusions:  . sodium chloride 100 mL/hr at 12/20/15 2142    Labs:  Recent Labs  12/19/15 0440  NA 138  K 3.9  CL 109  CO2 22  GLUCOSE 166*  BUN 26*  CREATININE 1.50*  CALCIUM 9.3   No results for input(s): AST, ALT, ALKPHOS, BILITOT, PROT, ALBUMIN in the last 72 hours.  Recent Labs  12/19/15 1229 12/20/15 0600 12/20/15 2055  WBC 6.9 10.3 7.7  NEUTROABS 4.7  --  5.8  HGB 7.5* 5.9* 8.0*  HCT 22.9* 18.2* 23.7*  MCV 86.3 86.5 86.3  PLT 179 155 150    Recent Labs  12/19/15 0440  TROPONINI <0.03   Invalid input(s): POCBNP  Recent Labs  12/19/15 0440  HGBA1C 5.6     Weights: Filed Weights   12/19/15 1211 12/20/15 0609 12/21/15 0517  Weight: 139 lb 4.8 oz (63.186 kg) 139 lb 1.6 oz (63.095 kg) 146 lb 1.6 oz (66.271 kg)     Radiology/Studies:  Nm Gi Blood Loss  12/19/2015  CLINICAL DATA:  GI bleed, last bleeding 11:45 a.m. today EXAM: NUCLEAR MEDICINE GASTROINTESTINAL BLEEDING SCAN TECHNIQUE: Sequential abdominal  images were obtained following intravenous administration of Tc-83m labeled red blood cells. RADIOPHARMACEUTICALS:  22.82 mCi Tc-74m in-vitro labeled red cells. COMPARISON:  None. FINDINGS: There is no abnormal radiotracer accumulation to suggest active GI bleed or localize the GI bleed. Normal activity seen within the blood pool, bladder and perineum. IMPRESSION: No active GI bleeding detected. Electronically Signed   By: Rolm Baptise M.D.   On: 12/19/2015 16:54   Ct Abdomen Pelvis W  Contrast  12/20/2015  CLINICAL DATA:  Acute anemia.  Concern for lower GI bleed. EXAM: CT ABDOMEN AND PELVIS WITH CONTRAST TECHNIQUE: Multidetector CT imaging of the abdomen and pelvis was performed using the standard protocol following bolus administration of intravenous contrast. CONTRAST:  90mL OMNIPAQUE IOHEXOL 300 MG/ML  SOLN The IV infiltrated during the injection which was stopped at 58 mL. Extravasation orders were placed in patient's chart. COMPARISON:  None. FINDINGS: There are trace bilateral pleural effusions. Dependent bibasilar atelectasis. Heart is mildly enlarged. Liver, gallbladder, spleen, pancreas, adrenals are unremarkable. Numerous bilateral renal cysts are noted which cannot be fully characterized on this essentially non contrast study due to infiltration of contrast. Aorta is calcified, non aneurysmal. Extensive sigmoid diverticulosis. No active diverticulitis. Stomach and small bowel are decompressed and grossly unremarkable. No free fluid, free air or adenopathy. Prostate is enlarged measuring 6.9 x 6.1 cm. Urinary bladder grossly unremarkable. No acute bony abnormality or focal bone lesion. IMPRESSION: Extensive sigmoid diverticulosis.  No active diverticulitis. Bilateral renal cystic lesions, most likely benign cysts although these cannot be characterized on this study. Trace bilateral pleural effusions.  Bibasilar atelectasis. Electronically Signed   By: Rolm Baptise M.D.   On: 12/20/2015 15:09   Dg Chest Port 1 View  12/19/2015  CLINICAL DATA:  Acute onset of left-sided chest pain, radiating to the left side of the back. Rectal bleeding. Initial encounter. EXAM: PORTABLE CHEST 1 VIEW COMPARISON:  Chest radiograph performed 12/15/2015 FINDINGS: The lungs are well-aerated. Minimal bilateral atelectasis is noted. There is no evidence of pleural effusion or pneumothorax. The cardiomediastinal silhouette is borderline normal in size. Right paratracheal prominence is relatively stable in  appearance and likely reflects normal vasculature. No acute osseous abnormalities are seen. IMPRESSION: Minimal bilateral atelectasis noted.  Lungs otherwise clear. Electronically Signed   By: Garald Balding M.D.   On: 12/19/2015 04:31   Dg Abd Acute W/chest  12/15/2015  CLINICAL DATA:  Rectal bleeding, abdominal cramping and pain. EXAM: DG ABDOMEN ACUTE W/ 1V CHEST COMPARISON:  05/17/2015 FINDINGS: Heart is mildly enlarged. Lungs are clear. No effusions. No acute bony abnormality. Moderate stool burden throughout the colon. Gas throughout nondistended large and small bowel. No evidence of bowel obstruction. No free air organomegaly. No suspicious calcification. No acute bony abnormality. IMPRESSION: Moderate stool burden throughout the colon. No evidence of bowel obstruction or free air. No active cardiopulmonary disease.  Mild cardiomegaly. Electronically Signed   By: Rolm Baptise M.D.   On: 12/15/2015 09:52     Assessment and Recommendation  82 y.o. male with on a kidney disease stage III aortic valve disease and had new onset chest pressure with significant anemia and concerns for GI bleed but no evidence of myocardial infarction at this time with no significant elevation of troponin. EKG has been stable with nonspecific T-wave inversions 1. Continuation of the treatment of anemia and bleeding with packed red blood cells now improving to hemoglobin of 8  2. No further cardiac diagnostics necessary at this time due to stability and no evidence  of myocardial infarction or recurrent chest discomfort 3. Begin ambulation and following for other further significant symptoms  4. Okay for discharge to home from cardiac standpoint with follow-up of aortic valve disease as an outpatient Signed, Serafina Royals M.D. FACC

## 2015-12-21 NOTE — Progress Notes (Signed)
GI Inpatient Follow-up Note  Patient Identification: Victor Dillon is a 82 y.o. male with rectal bleeding.   Subjective:  No more bleeding. Hgb up to 9.7.  No n/v, abd pain, f/c.  Tolerating PO.   Scheduled Inpatient Medications:  . sodium chloride   Intravenous Once  . aspirin EC  81 mg Oral Daily  . calcium carbonate  1,000 mg Oral BID  . cholecalciferol  1,000 Units Oral Daily  . docusate sodium  200 mg Oral BID  . metoprolol succinate  25 mg Oral Daily  . nitroGLYCERIN  0.5 inch Topical 4 times per day  . pantoprazole (PROTONIX) IV  40 mg Intravenous Q12H  . polyethylene glycol  17 g Oral Daily  . senna  2 tablet Oral BID  . simvastatin  20 mg Oral q1800  . sodium chloride flush  3 mL Intravenous Q12H  . terazosin  5 mg Oral QHS    Continuous Inpatient Infusions:   . sodium chloride 100 mL/hr at 12/20/15 2142    PRN Inpatient Medications:  acetaminophen **OR** acetaminophen, iohexol, lactulose, ondansetron **OR** ondansetron (ZOFRAN) IV, oxyCODONE  Review of Systems: Constitutional: Weight is stable.  Eyes: No changes in vision. ENT: No oral lesions, sore throat.  GI: see HPI.  Heme/Lymph: No easy bruising.  CV: No chest pain.  GU: No hematuria.  Integumentary: No rashes.  Neuro: No headaches.  Psych: + depression/anxiety.  Endocrine: No heat/cold intolerance.  Allergic/Immunologic: No urticaria.  Resp: No cough, SOB.  Musculoskeletal: No joint swelling.    Physical Examination: BP 149/63 mmHg  Pulse 61  Temp(Src) 98.3 F (36.8 C) (Oral)  Resp 18  Ht 5\' 8"  (1.727 m)  Wt 66.271 kg (146 lb 1.6 oz)  BMI 22.22 kg/m2  SpO2 99% Gen: NAD, alert and oriented x 4, elderly appearing Neck: supple, no JVD or thyromegaly Chest: CTA bilaterally, no wheezes, crackles, or other adventitious sounds CV: RRR, no m/g/c/r Abd: soft, NT, ND, +BS in all four quadrants; no HSM, guarding, ridigity, or rebound tenderness Ext: no edema, well perfused with 2+ pulses, Skin:  no rash or lesions noted Lymph: no LAD  Data: Lab Results  Component Value Date   WBC 9.4 12/21/2015   HGB 9.7* 12/21/2015   HCT 28.7* 12/21/2015   MCV 88.1 12/21/2015   PLT 171 12/21/2015    Recent Labs Lab 12/20/15 0600 12/20/15 2055 12/21/15 0619  HGB 5.9* 8.0* 9.7*   Lab Results  Component Value Date   NA 143 12/21/2015   K 4.3 12/21/2015   CL 118* 12/21/2015   CO2 19* 12/21/2015   BUN 27* 12/21/2015   CREATININE 1.53* 12/21/2015   Lab Results  Component Value Date   ALT 11* 12/15/2015   AST 20 12/15/2015   ALKPHOS 51 12/15/2015   BILITOT 0.6 12/15/2015    Recent Labs Lab 12/15/15 0910 12/19/15 0440  APTT 31  --   INR 1.03 1.01   Assessment/Plan: Victor Dillon is a 82 y.o. male a/w rectal bleeding.  Tagged scan negative.    No bleeding in 24 hours and Hgb up to 9.7  Recommendations: - safe for d/c in am if no further bleeding and Hgb stable.   Please call with questions or concerns.  REIN, Grace Blight, MD

## 2015-12-21 NOTE — Discharge Instructions (Signed)
°  DIET:  Cardiac diet  DISCHARGE CONDITION:  Stable  ACTIVITY:  Activity as tolerated  OXYGEN:  Home Oxygen: No.   Oxygen Delivery: room air  DISCHARGE LOCATION:  home    ADDITIONAL DISCHARGE INSTRUCTION:resume home pt   If you experience worsening of your admission symptoms, develop shortness of breath, life threatening emergency, suicidal or homicidal thoughts you must seek medical attention immediately by calling 911 or calling your MD immediately  if symptoms less severe.  You Must read complete instructions/literature along with all the possible adverse reactions/side effects for all the Medicines you take and that have been prescribed to you. Take any new Medicines after you have completely understood and accpet all the possible adverse reactions/side effects.   Please note  You were cared for by a hospitalist during your hospital stay. If you have any questions about your discharge medications or the care you received while you were in the hospital after you are discharged, you can call the unit and asked to speak with the hospitalist on call if the hospitalist that took care of you is not available. Once you are discharged, your primary care physician will handle any further medical issues. Please note that NO REFILLS for any discharge medications will be authorized once you are discharged, as it is imperative that you return to your primary care physician (or establish a relationship with a primary care physician if you do not have one) for your aftercare needs so that they can reassess your need for medications and monitor your lab values.

## 2015-12-21 NOTE — Care Management Note (Signed)
Case Management Note  Patient Details  Name: Victor Dillon MRN: [id number] Date of Birth: [birth year]  Subjective/Objective:      Referral for home health PT faxed and called to [facility]. Per Santiago Glad at [facility], [name] was followed for PT by the in-house Physical Therapy Dept at [facility]. That department has merged with [facility] and their patients are all transferred to [facility].               Action/Plan:   Expected Discharge Date:                  Expected Discharge Plan:     In-House Referral:     Discharge planning Services     Post Acute Care Choice:    Choice offered to:     DME Arranged:    DME Agency:     HH Arranged:    [place] Agency:     Status of Service:     Medicare Important Message Given:    Date Medicare IM Given:    Medicare IM give by:    Date Additional Medicare IM Given:    Additional Medicare Important Message give by:     If discussed at [place] of Stay Meetings, dates discussed:    Additional Comments:  Lasharon Dunivan A, RN 12/21/2015, 1:21 PM

## 2015-12-22 LAB — TYPE AND SCREEN
ABO/RH(D): O POS
Antibody Screen: NEGATIVE
UNIT DIVISION: 0
UNIT DIVISION: 0

## 2015-12-22 MED ORDER — POLYETHYLENE GLYCOL 3350 17 G PO PACK: 17.0000 g | PACK | Freq: Every day | ORAL | Status: AC

## 2015-12-22 MED ORDER — HYDRALAZINE HCL 20 MG/ML IJ SOLN
10.0000 mg | Freq: Once | INTRAMUSCULAR | Status: AC
  Administered 2015-12-22: 10 mg via INTRAVENOUS

## 2015-12-22 MED ORDER — HYDRALAZINE HCL 20 MG/ML IJ SOLN
INTRAMUSCULAR | Status: AC
  Filled 2015-12-22: qty 1

## 2015-12-22 NOTE — Care Management Important Message (Signed)
Important Message  Patient Details  Name: Victor Dillon MRN: [id number] Date of Birth: 1917-08-02   Medicare Important Message Given:  Yes    [name] 12/22/2015, 9:39 AM

## 2015-12-22 NOTE — Progress Notes (Addendum)
[place] at [facility]                                                                                                                                                                                            Patient Demographics   Victor Dillon, is a 82 y.o. male, DOB - 1917-06-27, [id number]  Admit date - 12/19/2015   Admitting Physician Harrie Foreman, MD  Outpatient Primary MD for the patient is No PCP Per Patient   LOS - 2  Subjective: Patient without any significant complaints hemoglobin is improved after transfusion denies any chest pains     Review of Systems:   CONSTITUTIONAL: No documented fever. No fatigue, weakness. No weight gain, no weight loss.  EYES: No blurry or double vision.  ENT: No tinnitus. No postnasal drip. No redness of the oropharynx.  RESPIRATORY: No cough, no wheeze, no hemoptysis. No dyspnea.  CARDIOVASCULAR: resolved chest pain. No orthopnea. No palpitations. No syncope.  GASTROINTESTINAL: No nausea, no vomiting or diarrhea. No abdominal pain. Positive melena, hematochezia.  GENITOURINARY: No dysuria or hematuria.  ENDOCRINE: No polyuria or nocturia. No heat or cold intolerance.  HEMATOLOGY: Positive anemia. No bruising. No bleeding.  INTEGUMENTARY: No rashes. No lesions.  MUSCULOSKELETAL: No arthritis. No swelling. No gout.  NEUROLOGIC: No numbness, tingling, or ataxia. No seizure-type activity.  PSYCHIATRIC: No anxiety. No insomnia. No ADD.    Vitals:   Filed Vitals:   12/21/15 2039 12/21/15 2323 12/22/15 0514 12/22/15 0800  BP: 149/63 155/68 138/51 178/69  Pulse: 61 62 60 63  Temp: 98.3 F (36.8 C)  98.3 F (36.8 C)   TempSrc: Oral     Resp: 18 18 18 18   Height:      Weight:   65.772 kg (145 lb)   SpO2: 99% 100% 100% 99%    Wt Readings from Last 3 Encounters:  12/22/15 65.772 kg (145 lb)  12/15/15 65.318 kg (144 lb)  10/14/15 69.854 kg (154 lb)     Intake/Output Summary (Last 24  hours) at 12/22/15 0930 Last data filed at 12/22/15 0824  Gross per 24 hour  Intake    480 ml  Output    525 ml  Net    -45 ml    Physical Exam:   GENERAL: Pleasant-appearing in no apparent distress.  HEAD, EYES, EARS, NOSE AND THROAT: Atraumatic, normocephalic. Extraocular muscles are intact. Pupils equal and reactive to light. Sclerae anicteric. No conjunctival injection. No oro-pharyngeal erythema.  NECK: Supple. There is no jugular venous distention. No bruits, no lymphadenopathy, no thyromegaly.  HEART: Regular rate and rhythm,. No murmurs,  no rubs, no clicks.  LUNGS: Clear to auscultation bilaterally. No rales or rhonchi. No wheezes.  ABDOMEN: Soft, flat, nontender, nondistended. Has good bowel sounds. No hepatosplenomegaly appreciated.  EXTREMITIES: No evidence of any cyanosis, clubbing, or peripheral edema.  +2 pedal and radial pulses bilaterally.  NEUROLOGIC: The patient is alert, awake, and oriented x3 with no focal motor or sensory deficits appreciated bilaterally.  SKIN: Moist and warm with no rashes appreciated.  Psych: Not anxious, depressed LN: No inguinal LN enlargement    Antibiotics   Anti-infectives    None      Medications   Scheduled Meds: . sodium chloride   Intravenous Once  . aspirin EC  81 mg Oral Daily  . calcium carbonate  1,000 mg Oral BID  . cholecalciferol  1,000 Units Oral Daily  . docusate sodium  200 mg Oral BID  . metoprolol succinate  25 mg Oral Daily  . nitroGLYCERIN  0.5 inch Topical 4 times per day  . pantoprazole (PROTONIX) IV  40 mg Intravenous Q12H  . polyethylene glycol  17 g Oral Daily  . senna  2 tablet Oral BID  . simvastatin  20 mg Oral q1800  . sodium chloride flush  3 mL Intravenous Q12H  . terazosin  5 mg Oral QHS   Continuous Infusions: . sodium chloride 100 mL/hr at 12/20/15 2142   PRN Meds:.acetaminophen **OR** acetaminophen, iohexol, lactulose, ondansetron **OR** ondansetron (ZOFRAN) IV, oxyCODONE   Data Review:    Micro Results No results found for this or any previous visit (from the past 240 hour(s)).  Radiology Reports Nm Gi Blood Loss  12/19/2015  CLINICAL DATA:  GI bleed, last bleeding 11:45 a.m. today EXAM: NUCLEAR MEDICINE GASTROINTESTINAL BLEEDING SCAN TECHNIQUE: Sequential abdominal images were obtained following intravenous administration of Tc-13m labeled red blood cells. RADIOPHARMACEUTICALS:  22.82 mCi Tc-33m in-vitro labeled red cells. COMPARISON:  None. FINDINGS: There is no abnormal radiotracer accumulation to suggest active GI bleed or localize the GI bleed. Normal activity seen within the blood pool, bladder and perineum. IMPRESSION: No active GI bleeding detected. Electronically Signed   By: Rolm Baptise M.D.   On: 12/19/2015 16:54   Ct Abdomen Pelvis W Contrast  12/20/2015  CLINICAL DATA:  Acute anemia.  Concern for lower GI bleed. EXAM: CT ABDOMEN AND PELVIS WITH CONTRAST TECHNIQUE: Multidetector CT imaging of the abdomen and pelvis was performed using the standard protocol following bolus administration of intravenous contrast. CONTRAST:  47mL OMNIPAQUE IOHEXOL 300 MG/ML  SOLN The IV infiltrated during the injection which was stopped at 58 mL. Extravasation orders were placed in patient's chart. COMPARISON:  None. FINDINGS: There are trace bilateral pleural effusions. Dependent bibasilar atelectasis. Heart is mildly enlarged. Liver, gallbladder, spleen, pancreas, adrenals are unremarkable. Numerous bilateral renal cysts are noted which cannot be fully characterized on this essentially non contrast study due to infiltration of contrast. Aorta is calcified, non aneurysmal. Extensive sigmoid diverticulosis. No active diverticulitis. Stomach and small bowel are decompressed and grossly unremarkable. No free fluid, free air or adenopathy. Prostate is enlarged measuring 6.9 x 6.1 cm. Urinary bladder grossly unremarkable. No acute bony abnormality or focal bone lesion. IMPRESSION: Extensive sigmoid  diverticulosis.  No active diverticulitis. Bilateral renal cystic lesions, most likely benign cysts although these cannot be characterized on this study. Trace bilateral pleural effusions.  Bibasilar atelectasis. Electronically Signed   By: Rolm Baptise M.D.   On: 12/20/2015 15:09   Dg Chest Port 1 View  12/19/2015  CLINICAL DATA:  Acute  onset of left-sided chest pain, radiating to the left side of the back. Rectal bleeding. Initial encounter. EXAM: PORTABLE CHEST 1 VIEW COMPARISON:  Chest radiograph performed 12/15/2015 FINDINGS: The lungs are well-aerated. Minimal bilateral atelectasis is noted. There is no evidence of pleural effusion or pneumothorax. The cardiomediastinal silhouette is borderline normal in size. Right paratracheal prominence is relatively stable in appearance and likely reflects normal vasculature. No acute osseous abnormalities are seen. IMPRESSION: Minimal bilateral atelectasis noted.  Lungs otherwise clear. Electronically Signed   By: Garald Balding M.D.   On: 12/19/2015 04:31   Dg Abd Acute W/chest  12/15/2015  CLINICAL DATA:  Rectal bleeding, abdominal cramping and pain. EXAM: DG ABDOMEN ACUTE W/ 1V CHEST COMPARISON:  05/17/2015 FINDINGS: Heart is mildly enlarged. Lungs are clear. No effusions. No acute bony abnormality. Moderate stool burden throughout the colon. Gas throughout nondistended large and small bowel. No evidence of bowel obstruction. No free air organomegaly. No suspicious calcification. No acute bony abnormality. IMPRESSION: Moderate stool burden throughout the colon. No evidence of bowel obstruction or free air. No active cardiopulmonary disease.  Mild cardiomegaly. Electronically Signed   By: Rolm Baptise M.D.   On: 12/15/2015 09:52     CBC  Recent Labs Lab 12/19/15 0440 12/19/15 1229 12/20/15 0600 12/20/15 2055 12/21/15 0619  WBC 6.9 6.9 10.3 7.7 9.4  HGB 8.7* 7.5* 5.9* 8.0* 9.7*  HCT 26.2* 22.9* 18.2* 23.7* 28.7*  PLT 171 179 155 150 171  MCV 87.1  86.3 86.5 86.3 88.1  MCH 28.9 28.3 28.0 29.3 29.7  MCHC 33.2 32.8 32.4 33.9 33.7  RDW 20.4* 21.0* 20.1* 17.3* 17.8*  LYMPHSABS  --  1.6  --  1.2  --   MONOABS  --  0.5  --  0.5  --   EOSABS  --  0.1  --  0.2  --   BASOSABS  --  0.0  --  0.0  --     Chemistries   Recent Labs Lab 12/17/15 0542 12/17/15 1248 12/19/15 0440 12/21/15 0619  NA 136 140 138 143  K 3.8 3.9 3.9 4.3  CL 108 110 109 118*  CO2 20* 20* 22 19*  GLUCOSE 100* 124* 166* 104*  BUN 30* 30* 26* 27*  CREATININE 2.15* 1.83* 1.50* 1.53*  CALCIUM 8.7* 9.0 9.3 8.6*   ------------------------------------------------------------------------------------------------------------------ estimated creatinine clearance is 26.3 mL/min (by C-G formula based on Cr of 1.53). ------------------------------------------------------------------------------------------------------------------ No results for input(s): HGBA1C in the last 72 hours. ------------------------------------------------------------------------------------------------------------------ No results for input(s): CHOL, HDL, LDLCALC, TRIG, CHOLHDL, LDLDIRECT in the last 72 hours. ------------------------------------------------------------------------------------------------------------------ No results for input(s): TSH, T4TOTAL, T3FREE, THYROIDAB in the last 72 hours.  Invalid input(s): FREET3 ------------------------------------------------------------------------------------------------------------------ No results for input(s): VITAMINB12, FOLATE, FERRITIN, TIBC, IRON, RETICCTPCT in the last 72 hours.  Coagulation profile  Recent Labs Lab 12/19/15 0440  INR 1.01    No results for input(s): DDIMER in the last 72 hours.  Cardiac Enzymes  Recent Labs Lab 12/19/15 0440  TROPONINI <0.03   ------------------------------------------------------------------------------------------------------------------ Invalid input(s): POCBNP    Assessment & Plan    This is a 82 year old African-American male admitted for chest pain. 1. Chest pain: Atypical; due to symptomatic anemia, no further workup  2. Acute blood loss anemia due to likely lower GI bleed- status post transfusion CT of the abdomen with diverticulosis no mass identified, leading has stopped 3 Essential hypertension: Blood pressure stable continue metoprolol 4. BPH: Continue terazosin and 4. Constipation: Continue lactulose and MiraLAX on discharge 5. DVT prophylaxis: SCDs  6. GI prophylaxis: Due to GI bleed I will place him on Protonix  Patient was very weak will need physical therapy evaluation CODE STATUS DO NOT RESUSCITATE     Code Status Orders        Start     Ordered   12/19/15 1229  Full code   Continuous     12/19/15 1228    Code Status History    Date Active Date Inactive Code Status Order ID Comments User Context   12/15/2015  2:50 PM 12/17/2015  6:34 PM Full Code [id number]  Idalia Needle, RN Inpatient   07/17/2015  8:07 PM 07/18/2015  9:28 PM Full Code [id number]  Epifanio Lesches, MD ED    Advance Directive Documentation        Most Recent Value   Type of Advance Directive  Healthcare Power of [place], Living will   Pre-existing out of facility DNR order (yellow form or pink MOST form)     "MOST" Form in Place?             Consults GI cardiology DVT Prophylaxis  SCDs  Lab Results  Component Value Date   PLT 171 12/21/2015     Time Spent in minutes  50min  Teejay Meader, Chana Bode M.D on 12/22/2015 at 9:30 AM  Between 7am to 6pm - Pager - [phone]  After 6pm go to [email or web address] - password [email or web address] [facility]   Office  [phone]

## 2015-12-22 NOTE — Care Management (Signed)
Patient is for discharge home today with [facility] PT and Aide.  Informed by [facility] that received referral order  12/21/2015.  [facility] will be scheduling patient with primary care physicain appointment through [place] for 5 days- Dr Janace Hoard.  Sent update referral to [facility] via fax.

## 2015-12-23 LAB — CEA: CEA: 2.9 ng/mL (ref 0.0–4.7)

## 2016-01-14 ENCOUNTER — Encounter: Payer: Self-pay | Admitting: Emergency Medicine

## 2016-01-14 ENCOUNTER — Emergency Department
Admission: EM | Admit: 2016-01-14 | Discharge: 2016-01-14 | Disposition: A | Discharge status: Home / Self Care | Payer: Medicare Other | Attending: Emergency Medicine | Admitting: Emergency Medicine

## 2016-01-14 DIAGNOSIS — Z87891 Personal history of nicotine dependence: Secondary | ICD-10-CM | POA: Insufficient documentation

## 2016-01-14 DIAGNOSIS — I10 Essential (primary) hypertension: Secondary | ICD-10-CM | POA: Diagnosis not present

## 2016-01-14 DIAGNOSIS — K5641 Fecal impaction: Secondary | ICD-10-CM | POA: Insufficient documentation

## 2016-01-14 NOTE — Discharge Instructions (Signed)
Fecal Impaction °A fecal impaction happens when there is a large, firm amount of stool (or feces) that cannot be passed. The impacted stool is usually in the rectum, which is the lowest part of the large bowel. The impacted stool can block the colon and cause significant problems. °CAUSES  °The longer stool stays in the rectum, the harder it gets. Anything that slows down your bowel movements can lead to fecal impaction, such as: °· Constipation. This can be a long-standing (chronic) problem or can happen suddenly (acute). °· Painful conditions of the rectum, such as hemorrhoids or anal fissures. The pain of these conditions can make you try to avoid having bowel movements. °· Narcotic pain-relieving medicines, such as methadone, morphine, or codeine. °· Not drinking enough fluids. °· Inactivity and bed rest over long periods of time. °· Diseases of the brain or nervous system that damage the nerves controlling the muscles of the intestines. °SIGNS AND SYMPTOMS  °· Lack of normal bowel movements or changes in bowel patterns. °· Sense of fullness in the rectum but unable to pass stool. °· Pain or cramps in the abdominal area (often after meals). °· Thin, watery discharge from the rectum. °DIAGNOSIS  °Your health care provider may suspect that you have a fecal impaction based on your symptoms and a physical exam. This will include an exam of your rectum. Sometimes X-rays or lab testing may be needed to confirm the diagnosis and to be sure there are no other problems.  °TREATMENT  °· Initially an impaction can be removed manually. Using a gloved finger, your health care provider can remove hard stool from your rectum. °· Medicine is sometimes needed. A suppository or enema can be given in the rectum to soften the stool, which can stimulate a bowel movement. Medicines can also be given by mouth (orally). °· Though rare, surgery may be needed if the colon has torn (perforated) due to blockage. °HOME CARE INSTRUCTIONS   °· Develop regular bowel habits. This could include getting in the habit of having a bowel movement after your morning cup of coffee or after eating. Be sure to allow yourself enough time on the toilet. °· Maintain a high-fiber diet. °· Drink enough fluids to keep your urine clear or pale yellow as directed by your health care provider. °· Exercise regularly. °· If you begin to get constipated, increase the amount of fiber in your diet. Eat plenty of fruits, vegetables, whole wheat breads, bran, oatmeal, and similar products. °· Take natural fiber laxatives or other laxatives only as directed by your health care provider. °SEEK MEDICAL CARE IF:  °· You have ongoing rectal pain. °· You require enemas or suppositories more than twice a week. °· You have rectal bleeding. °· You have continued problems, or you develop abdominal pain. °· You have thin, pencil-like stools. °SEEK IMMEDIATE MEDICAL CARE IF:  °You have black or tarry stools. °MAKE SURE YOU:  °· Understand these instructions. °· Will watch your condition. °· Will get help right away if you are not doing well or get worse. °  °This information is not intended to replace advice given to you by your health care provider. Make sure you discuss any questions you have with your health care provider. °  °Document Released: 07/31/2004 Document Revised: 08/29/2013 Document Reviewed: 05/15/2013 °Elsevier Interactive Patient Education ©2016 [employer] ° °

## 2016-01-14 NOTE — ED Notes (Signed)
Patient brought in for fecal impaction. Dr. Jimmye Norman at bedside and performed manual disimpaction.

## 2016-01-14 NOTE — ED Notes (Signed)
Patient brought in by EMS from [facility] for fecal impaction since Saturday

## 2016-01-14 NOTE — ED Provider Notes (Signed)
[facility] Emergency Department Provider Note     Time seen: ----------------------------------------- 10:58 AM on 01/14/2016 -----------------------------------------    I have reviewed the triage vital signs and the nursing notes.   HISTORY  Chief Complaint Fecal Impaction    HPI Victor Dillon is a 82 y.o. male who presents to ER for fecal impaction. EMS reports patient was brought from [facility] for fecal impaction since Saturday. Currently today is Wednesday. Patient reports frequent history of constipation, he has been taking his medications without any improvement. Patient denies any fevers chills, does have lower abdominal cramping.   Past Medical History  Diagnosis Date  . Hypertension     Patient Active Problem List   Diagnosis Date Noted  . Lower GI bleed 12/20/2015  . Rectal bleed 12/15/2015  . Chest pain 07/17/2015    Past Surgical History  Procedure Laterality Date  . Appendectomy      Allergies Review of patient's allergies indicates no known allergies.  Social History Social History  Substance Use Topics  . Smoking status: Former Research scientist (life sciences)  . Smokeless tobacco: None  . Alcohol Use: No    Review of Systems Constitutional: Negative for fever. Eyes: Negative for visual changes. ENT: Negative for sore throat. Cardiovascular: Negative for chest pain. Respiratory: Negative for shortness of breath. Gastrointestinal: Positive for abdominal pain and constipation Genitourinary: Negative for dysuria. Musculoskeletal: Negative for back pain. Skin: Negative for rash. Neurological: Negative for headaches, focal weakness or numbness.  10-point ROS otherwise negative.  ____________________________________________   PHYSICAL EXAM:  VITAL SIGNS: ED Triage Vitals  Enc Vitals Group     BP --      Pulse --      Resp --      Temp --      Temp src --      SpO2 --      Weight --      Height --      Head Cir --    Peak Flow --      Pain Score 01/14/16 1055 5     Pain Loc --      Pain Edu? --      Excl. in [place]? --     Constitutional: Alert and oriented. Well appearing and in no distress. Eyes: Conjunctivae are normal. PERRL. Normal extraocular movements. Cardiovascular: Normal rate, regular rhythm. Normal and symmetric distal pulses are present in all extremities. No murmurs, rubs, or gallops. Respiratory: Normal respiratory effort without tachypnea nor retractions. Breath sounds are clear and equal bilaterally. No wheezes/rales/rhonchi. Gastrointestinal: Mild nonfocal tenderness, normal bowel sounds. Rectal: High fecal impaction, slight gross blood present. No hemorrhoids Musculoskeletal: Nontender with normal range of motion in all extremities. Neurologic:  Normal speech and language. No gross focal neurologic deficits are appreciated.  Skin:  Skin is warm, dry and intact. No rash noted. ___________________________________________  ED COURSE:  Pertinent labs & imaging results that were available during my care of the patient were reviewed by me and considered in my medical decision making (see chart for details). Patient will need manual disimpaction and reevaluation.  I did perform disimpaction with large amount of hard stool.  ____________________________________________  FINAL ASSESSMENT AND PLAN  Fecal impaction  Plan: Patient has been disimpacted here, this is a chronic problem for him. Caregiver states he doesn't drink enough, doesn't ambulate enough and is currently taking lactulose. He is stable for outpatient follow-up.   Earleen Newport, MD   Earleen Newport, MD 01/14/16 [phone]

## 2016-02-03 ENCOUNTER — Emergency Department
Admission: EM | Admit: 2016-02-03 | Discharge: 2016-02-03 | Disposition: A | Discharge status: Home / Self Care | Payer: Medicare Other | Attending: Emergency Medicine | Admitting: Emergency Medicine

## 2016-02-03 ENCOUNTER — Emergency Department: Payer: Medicare Other

## 2016-02-03 ENCOUNTER — Encounter: Payer: Self-pay | Admitting: Emergency Medicine

## 2016-02-03 DIAGNOSIS — R079 Chest pain, unspecified: Secondary | ICD-10-CM | POA: Diagnosis not present

## 2016-02-03 DIAGNOSIS — Z79899 Other long term (current) drug therapy: Secondary | ICD-10-CM | POA: Insufficient documentation

## 2016-02-03 DIAGNOSIS — Z7982 Long term (current) use of aspirin: Secondary | ICD-10-CM | POA: Insufficient documentation

## 2016-02-03 DIAGNOSIS — I10 Essential (primary) hypertension: Secondary | ICD-10-CM | POA: Insufficient documentation

## 2016-02-03 DIAGNOSIS — Z87891 Personal history of nicotine dependence: Secondary | ICD-10-CM | POA: Diagnosis not present

## 2016-02-03 HISTORY — DX: Cardiac murmur, unspecified: R01.1

## 2016-02-03 HISTORY — DX: Pure hypercholesterolemia, unspecified: E78.00

## 2016-02-03 HISTORY — DX: Atherosclerotic heart disease of native coronary artery without angina pectoris: I25.10

## 2016-02-03 LAB — COMPREHENSIVE METABOLIC PANEL
ALBUMIN: 3.9 g/dL (ref 3.5–5.0)
ALT: 11 U/L — AB (ref 17–63)
AST: 30 U/L (ref 15–41)
Alkaline Phosphatase: 70 U/L (ref 38–126)
Anion gap: 8 (ref 5–15)
BILIRUBIN TOTAL: 0.3 mg/dL (ref 0.3–1.2)
BUN: 25 mg/dL — AB (ref 6–20)
CHLORIDE: 109 mmol/L (ref 101–111)
CO2: 20 mmol/L — ABNORMAL LOW (ref 22–32)
CREATININE: 1.52 mg/dL — AB (ref 0.61–1.24)
Calcium: 9.6 mg/dL (ref 8.9–10.3)
GFR calc Af Amer: 43 mL/min — ABNORMAL LOW (ref >60)
GFR calc non Af Amer: 37 mL/min — ABNORMAL LOW (ref >60)
GLUCOSE: 116 mg/dL — AB (ref 65–99)
POTASSIUM: 3.6 mmol/L (ref 3.5–5.1)
Sodium: 137 mmol/L (ref 135–145)
Total Protein: 7.7 g/dL (ref 6.5–8.1)

## 2016-02-03 LAB — CBC
HEMATOCRIT: 30.2 % — AB (ref 40.0–52.0)
Hemoglobin: 9.7 g/dL — ABNORMAL LOW (ref 13.0–18.0)
MCH: 27.4 pg (ref 26.0–34.0)
MCHC: 32.1 g/dL (ref 32.0–36.0)
MCV: 85.5 fL (ref 80.0–100.0)
PLATELETS: 288 10*3/uL (ref 150–440)
RBC: 3.53 MIL/uL — AB (ref 4.40–5.90)
RDW: 18 % — AB (ref 11.5–14.5)
WBC: 10.6 10*3/uL (ref 3.8–10.6)

## 2016-02-03 LAB — LIPASE, BLOOD: Lipase: 12 U/L (ref 11–51)

## 2016-02-03 LAB — TROPONIN I: Troponin I: <0.03 ng/mL (ref <0.031)

## 2016-02-03 NOTE — ED Provider Notes (Signed)
[facility] Emergency Department Provider Note  Time seen: 3:52 PM  I have reviewed the triage vital signs and the nursing notes.   HISTORY  Chief Complaint Chest Pain    HPI Victor Dillon is a 82 y.o. male with a past medical history of hypertension, who presents to the emergency department with chest pain. According to patient beginning around 2:30 to 3:00 he experience chest discomfort he states initially the chest discomfort was moderate, dull aching pain associated with shortness of breath. He states his shortness breath has resolved and currently he has some mild discomfort in the center of his chest. Denies any abdominal pain, diaphoresis, nausea or vomiting. Denies any radiation of the pain. Denies any known modifying factors.     Past Medical History  Diagnosis Date  . Hypertension     Patient Active Problem List   Diagnosis Date Noted  . Lower GI bleed 12/20/2015  . Rectal bleed 12/15/2015  . Chest pain 07/17/2015    Past Surgical History  Procedure Laterality Date  . Appendectomy      Current Outpatient Rx  Name  Route  Sig  Dispense  Refill  . acetaminophen (TYLENOL) 325 MG tablet   Oral   Take 650 mg by mouth every 4 (four) hours as needed.         Marland Kitchen aspirin EC 81 MG tablet   Oral   Take 81 mg by mouth daily.         . calcium carbonate (CALCIUM 600) 600 MG TABS tablet   Oral   Take 600 mg by mouth 2 (two) times daily.         . Cholecalciferol 10000 units TABS   Oral   Take 1 tablet by mouth daily.         Marland Kitchen docusate sodium (COLACE) 100 MG capsule   Oral   Take 2 capsules (200 mg total) by mouth 2 (two) times daily.   120 capsule   0   . finasteride (PROPECIA) 1 MG tablet   Oral   Take 1 mg by mouth daily.          . hydrALAZINE (APRESOLINE) 50 MG tablet   Oral   Take 50 mg by mouth 3 (three) times daily.         Marland Kitchen lactulose (CHRONULAC) 10 GM/15ML solution   Oral   Take 45 mLs (30 g total) by  mouth 2 (two) times daily as needed for mild constipation.   240 mL   0   . metoprolol succinate (TOPROL XL) 25 MG 24 hr tablet   Oral   Take 1 tablet (25 mg total) by mouth daily.   30 tablet   0   . polyethylene glycol (MIRALAX / GLYCOLAX) packet   Oral   Take 17 g by mouth daily.   14 each   0   . senna (SENOKOT) 8.6 MG TABS tablet   Oral   Take 2 tablets (17.2 mg total) by mouth 2 (two) times daily.   120 each   0   . terazosin (HYTRIN) 5 MG capsule   Oral   Take 5 mg by mouth at bedtime.           Allergies Review of patient's allergies indicates no known allergies.  No family history on file.  Social History Social History  Substance Use Topics  . Smoking status: Former Research scientist (life sciences)  . Smokeless tobacco: Not on file  . Alcohol Use: No  Review of Systems Constitutional: Negative for fever. Cardiovascular: Positive for central chest pain. Mild currently. Respiratory: Shortness of breath initially although he states shortness breath is gone now. Gastrointestinal: Negative for abdominal pain, vomiting and diarrhea. Musculoskeletal: Negative for back pain Neurological: Negative for headache 10-point ROS otherwise negative.  ____________________________________________   PHYSICAL EXAM:  VITAL SIGNS: ED Triage Vitals  Enc Vitals Group     BP --      Pulse --      Resp --      Temp --      Temp src --      SpO2 --      Weight --      Height --      Head Cir --      Peak Flow --      Pain Score --      Pain Loc --      Pain Edu? --      Excl. in [place]? --     Constitutional: Alert and oriented. Well appearing and in no distress. Eyes: Normal exam ENT   Head: Normocephalic and atraumatic.   Mouth/Throat: Mucous membranes are moist. Cardiovascular: Normal rate, regular rhythm. No murmur Respiratory: Normal respiratory effort without tachypnea nor retractions. Breath sounds are clear  Gastrointestinal: Soft and nontender. No distention.    Musculoskeletal: Nontender with normal range of motion in all extremities. Neurologic:  Normal speech and language. No gross focal neurologic deficits  Skin:  Skin is warm, dry and intact.  Psychiatric: Mood and affect are normal. Speech and behavior are normal.   ____________________________________________    EKG  EKG reviewed and interpreted by myself shows normal sinus rhythm at 84 bpm, narrow QRS, normal axis, normal intervals, nonspecific ST changes.  ____________________________________________    RADIOLOGY  Chest x-ray shows small bilateral effusions, otherwise within normal limits  ____________________________________________    INITIAL IMPRESSION / ASSESSMENT AND PLAN / ED COURSE  Pertinent labs & imaging results that were available during my care of the patient were reviewed by me and considered in my medical decision making (see chart for details).  Patient presents to the emergency department with central chest pain beginning approximately one hour before presentation. Patient states near complete resolution of the chest discomfort he states chest mild chest pain currently. No history of cardiac disease per patient. We will check labs, chest x-ray, EKG and closely monitor in the emergency department.  Patient is a very difficult stick, a lot of trouble in obtaining labs. While in the emergency department the patient had a very large bowel movement and states complete resolution of all pain after his bowel movement. Currently has no complaints.  Patient's labs have resulted in normal results. Negative troponin. Patient's caregiver is here with the patient who states after the bowel movement the patient has not had any further complaints of chest discomfort. She states the patient has presented similarly before. We will discharge the patient home at this time with primary care follow-up.  ____________________________________________   FINAL CLINICAL IMPRESSION(S) /  ED DIAGNOSES  Chest pain   Harvest Dark, MD 02/03/16 1919

## 2016-02-03 NOTE — Discharge Instructions (Signed)
You have been seen in the emergency department today for chest pain. Your workup has shown normal results. As we discussed please follow-up with your primary care physician in the next 1-2 days for recheck. Return to the emergency department for any further chest pain, trouble breathing, or any other symptom personally concerning to yourself. ° °Nonspecific Chest Pain °It is often hard to find the cause of chest pain. There is always a chance that your pain could be related to something serious, such as a heart attack or a blood clot in your lungs. Chest pain can also be caused by conditions that are not life-threatening. If you have chest pain, it is very important to follow up with your doctor. ° °HOME CARE °· If you were prescribed an antibiotic medicine, finish it all even if you start to feel better. °· Avoid any activities that cause chest pain. °· Do not use any tobacco products, including cigarettes, chewing tobacco, or electronic cigarettes. If you need help quitting, ask your doctor. °· Do not drink alcohol. °· Take medicines only as told by your doctor. °· Keep all follow-up visits as told by your doctor. This is important. This includes any further testing if your chest pain does not go away. °· Your doctor may tell you to keep your head raised (elevated) while you sleep. °· Make lifestyle changes as told by your doctor. These may include: °¨ Getting regular exercise. Ask your doctor to suggest some activities that are safe for you. °¨ Eating a heart-healthy diet. Your doctor or a diet specialist (dietitian) can help you to learn healthy eating options. °¨ Maintaining a healthy weight. °¨ Managing diabetes, if necessary. °¨ Reducing stress. °GET HELP IF: °· Your chest pain does not go away, even after treatment. °· You have a rash with blisters on your chest. °· You have a fever. °GET HELP RIGHT AWAY IF: °· Your chest pain is worse. °· You have an increasing cough, or you cough up blood. °· You have  severe belly (abdominal) pain. °· You feel extremely weak. °· You pass out (faint). °· You have chills. °· You have sudden, unexplained chest discomfort. °· You have sudden, unexplained discomfort in your arms, back, neck, or jaw. °· You have shortness of breath at any time. °· You suddenly start to sweat, or your skin gets clammy. °· You feel nauseous. °· You vomit. °· You suddenly feel light-headed or dizzy. °· Your heart begins to beat quickly, or it feels like it is skipping beats. °These symptoms may be an emergency. Do not wait to see if the symptoms will go away. Get medical help right away. Call your local emergency services (911 in the [place] Do not drive yourself to the hospital. °  °This information is not intended to replace advice given to you by your health care provider. Make sure you discuss any questions you have with your health care provider. °  °Document Released: 04/26/2008 Document Revised: 11/29/2014 Document Reviewed: 06/14/2014 °Elsevier Interactive Patient Education ©2016 Elsevier Inc. ° °

## 2016-02-03 NOTE — ED Notes (Signed)
Pt to ED via EMS from [facility] c/o x1 episode midsternal, non-radiating chest pain that has resolved while en route with EMS. Denies diaphoresis, n/v. States pain has been on and off for 2/3 days. Pt also c/o constipation on arrival but had episode of bowel incontinence when placed in bed from stretcher.

## 2016-03-18 ENCOUNTER — Emergency Department
Admission: EM | Admit: 2016-03-18 | Discharge: 2016-03-18 | Disposition: A | Discharge status: Home / Self Care | Payer: Medicare Other | Attending: Emergency Medicine | Admitting: Emergency Medicine

## 2016-03-18 ENCOUNTER — Emergency Department: Payer: Medicare Other

## 2016-03-18 DIAGNOSIS — I10 Essential (primary) hypertension: Secondary | ICD-10-CM | POA: Insufficient documentation

## 2016-03-18 DIAGNOSIS — I2510 Atherosclerotic heart disease of native coronary artery without angina pectoris: Secondary | ICD-10-CM | POA: Diagnosis not present

## 2016-03-18 DIAGNOSIS — Z87891 Personal history of nicotine dependence: Secondary | ICD-10-CM | POA: Insufficient documentation

## 2016-03-18 DIAGNOSIS — R079 Chest pain, unspecified: Secondary | ICD-10-CM | POA: Diagnosis not present

## 2016-03-18 LAB — COMPREHENSIVE METABOLIC PANEL
ALK PHOS: 55 U/L (ref 38–126)
ALT: 9 U/L — AB (ref 17–63)
AST: 18 U/L (ref 15–41)
Albumin: 3.7 g/dL (ref 3.5–5.0)
Anion gap: 11 (ref 5–15)
BUN: 31 mg/dL — AB (ref 6–20)
CHLORIDE: 109 mmol/L (ref 101–111)
CO2: 23 mmol/L (ref 22–32)
CREATININE: 1.83 mg/dL — AB (ref 0.61–1.24)
Calcium: 9.7 mg/dL (ref 8.9–10.3)
GFR, EST AFRICAN AMERICAN: 34 mL/min — AB (ref >60)
GFR, EST NON AFRICAN AMERICAN: 30 mL/min — AB (ref >60)
Glucose, Bld: 127 mg/dL — ABNORMAL HIGH (ref 65–99)
Potassium: 3.6 mmol/L (ref 3.5–5.1)
SODIUM: 143 mmol/L (ref 135–145)
TOTAL PROTEIN: 6.8 g/dL (ref 6.5–8.1)
Total Bilirubin: 0.6 mg/dL (ref 0.3–1.2)

## 2016-03-18 LAB — CBC WITH DIFFERENTIAL/PLATELET
BASOS ABS: 0 10*3/uL (ref 0–0.1)
Basophils Relative: 0 %
EOS PCT: 2 %
Eosinophils Absolute: 0.1 10*3/uL (ref 0–0.7)
HCT: 29 % — ABNORMAL LOW (ref 40.0–52.0)
HEMOGLOBIN: 9.3 g/dL — AB (ref 13.0–18.0)
LYMPHS ABS: 1.7 10*3/uL (ref 1.0–3.6)
LYMPHS PCT: 31 %
MCH: 26.9 pg (ref 26.0–34.0)
MCHC: 32.2 g/dL (ref 32.0–36.0)
MCV: 83.5 fL (ref 80.0–100.0)
Monocytes Absolute: 0.5 10*3/uL (ref 0.2–1.0)
Monocytes Relative: 9 %
NEUTROS PCT: 58 %
Neutro Abs: 3.2 10*3/uL (ref 1.4–6.5)
PLATELETS: 241 10*3/uL (ref 150–440)
RBC: 3.47 MIL/uL — AB (ref 4.40–5.90)
RDW: 19.8 % — ABNORMAL HIGH (ref 11.5–14.5)
WBC: 5.5 10*3/uL (ref 3.8–10.6)

## 2016-03-18 LAB — TROPONIN I: Troponin I: <0.03 ng/mL (ref <0.031)

## 2016-03-18 NOTE — ED Provider Notes (Signed)
Time Seen: Approximately 1915  I have reviewed the triage notes  Chief Complaint: Chest Pain   History of Present Illness: Victor Dillon is a 82 y.o. male *who presents with a brief episode of substernal chest discomfort. Patient states he went to the bathroom and had a bowel movement and noticed onset of pain actually after he ate a small piece of cheese after coming back from the bathroom. States the pain is no longer present and lasted approximately an hour to an hour and a half. Denies any associated symptoms with it such as nausea, vomiting, diaphoresis or radiation of pain to the arm, neck, jaw region. Denies any back discomfort. He states he may have had some mild shortness of breath but denies any of these symptoms currently. He denies any abdominal pain or back or flank discomfort. He denies any pleuritic or positional component   Past Medical History  Diagnosis Date  . Hypertension   . Hypercholesteremia   . Coronary artery disease   . Murmur, heart     Patient Active Problem List   Diagnosis Date Noted  . Lower GI bleed 12/20/2015  . Rectal bleed 12/15/2015  . Chest pain 07/17/2015    Past Surgical History  Procedure Laterality Date  . Appendectomy      Past Surgical History  Procedure Laterality Date  . Appendectomy      Current Outpatient Rx  Name  Route  Sig  Dispense  Refill  . acetaminophen (TYLENOL) 325 MG tablet   Oral   Take 650 mg by mouth every 4 (four) hours as needed.         Marland Kitchen aspirin EC 81 MG tablet   Oral   Take 81 mg by mouth daily.         . calcium carbonate (CALCIUM 600) 600 MG TABS tablet   Oral   Take 600 mg by mouth 2 (two) times daily.         . Cholecalciferol 10000 units TABS   Oral   Take 1 tablet by mouth daily.         Marland Kitchen docusate sodium (COLACE) 100 MG capsule   Oral   Take 2 capsules (200 mg total) by mouth 2 (two) times daily.   120 capsule   0   . finasteride (PROPECIA) 1 MG tablet   Oral   Take 1 mg  by mouth daily.          . hydrALAZINE (APRESOLINE) 50 MG tablet   Oral   Take 50 mg by mouth 3 (three) times daily.         Marland Kitchen lactulose (CHRONULAC) 10 GM/15ML solution   Oral   Take 45 mLs (30 g total) by mouth 2 (two) times daily as needed for mild constipation.   240 mL   0   . metoprolol succinate (TOPROL XL) 25 MG 24 hr tablet   Oral   Take 1 tablet (25 mg total) by mouth daily.   30 tablet   0   . polyethylene glycol (MIRALAX / GLYCOLAX) packet   Oral   Take 17 g by mouth daily.   14 each   0   . senna (SENOKOT) 8.6 MG TABS tablet   Oral   Take 2 tablets (17.2 mg total) by mouth 2 (two) times daily.   120 each   0   . terazosin (HYTRIN) 5 MG capsule   Oral   Take 5 mg by mouth at bedtime.  Allergies:  Review of patient's allergies indicates no known allergies.  Family History: No family history on file.  Social History: Social History  Substance Use Topics  . Smoking status: Former Research scientist (life sciences)  . Smokeless tobacco: None  . Alcohol Use: No     Review of Systems:   10 point review of systems was performed and was otherwise negative:  Constitutional: No fever Eyes: No visual disturbances ENT: No sore throat, ear pain Cardiac: Chest pain described as sharp. Respiratory: No persistent shortness of breath, wheezing, or stridor Abdomen: No abdominal pain, no vomiting, No diarrhea Endocrine: No weight loss, No night sweats Extremities: No peripheral edema, cyanosis Skin: No rashes, easy bruising Neurologic: No focal weakness, trouble with speech or swollowing Urologic: No dysuria, Hematuria, or urinary frequency   Physical Exam:  ED Triage Vitals  Enc Vitals Group     BP 03/18/16 1902 138/60 mmHg     Pulse Rate 03/18/16 1902 86     Resp 03/18/16 1915 18     Temp 03/18/16 1905 97.4 F (36.3 C)     Temp src --      SpO2 03/18/16 1902 100 %     Weight --      Height --      Head Cir --      Peak Flow --      Pain Score 03/18/16  1903 7     Pain Loc --      Pain Edu? --      Excl. in [place]? --     General: Awake , Alert , and Oriented times 3; GCS 15 Head: Normal cephalic , atraumatic Eyes: Pupils equal , round, reactive to light Nose/Throat: No nasal drainage, patent upper airway without erythema or exudate.  Neck: Supple, Full range of motion, No anterior adenopathy or palpable thyroid masses Lungs: Clear to ascultation without wheezes , rhonchi, or rales Heart: Regular rate, regular rhythm With a grade 3/6 ejection murmur left sternal border. No gallops or rubs Abdomen: Soft, non tender without rebound, guarding , or rigidity; bowel sounds positive and symmetric in all 4 quadrants. No organomegaly .        Extremities: 2 plus symmetric pulses. No edema, clubbing or cyanosis Neurologic: normal ambulation, Motor symmetric without deficits, sensory intact Skin: warm, dry, no rashes No obvious reproducible chest wall discomfort  Labs:   All laboratory work was reviewed including any pertinent negatives or positives listed below:  Labs Reviewed  CBC WITH DIFFERENTIAL/PLATELET  COMPREHENSIVE METABOLIC PANEL  TROPONIN I   Patient's troponin was negative. He does have some renal insufficiency though review of his previous laboratory work does not show a significant change.  EKG: ED ECG REPORT I, Daymon Larsen, the attending physician, personally viewed and interpreted this ECG.  Date: 03/18/2016 EKG Time: 1905 Rate: 80 Rhythm: normal sinus rhythm with occasional PVCs QRS Axis: normal Intervals: normal ST/T Wave abnormalities: Nonspecific ST wave abnormalities Conduction Disturbances: none Narrative Interpretation: unremarkable No acute ischemic change  Radiology:   Narrative:    CLINICAL DATA: Patient with centralized chest pain and shortness of breath.  EXAM: CHEST 2 VIEW  COMPARISON: Chest radiograph 02/03/2016  FINDINGS: Monitoring leads overlie the patient. Stable cardiac and  mediastinal contours. Unchanged right peritracheal masslike density. No large area pulmonary consolidation. Thoracic spine degenerative changes. No pleural effusion or pneumothorax.  IMPRESSION: No active cardiopulmonary disease.   Electronically Signed By: Lovey Newcomer M.D. On: 03/18/2016 19:49  I personally reviewed the radiologic studies     ED Course: Patient was given some IV fluids here but otherwise had no chest pain upon arrival. I felt since he had the discomfort for several hours prior to arrival. No EKG findings and a negative troponin especially given the patient's age etc. the further testing was not necessary. The patient's pain occurred after swallowing some cheese and may be gastrointestinal in nature but I don't suspect this was a life-threatening cause for his chest pain on this particular visit. I felt was unlikely this was acute coronary syndrome, aortic dissection, cardiac tamponade , pulmonary embolism, etc.   Assessment:  Acute unspecified chest pain     Plan: * Outpatient management Patient was advised to return immediately if condition worsens. Patient was advised to follow up with their primary care physician or other specialized physicians involved in their outpatient care. The patient and/or family member/power of attorney had laboratory results reviewed at the bedside. All questions and concerns were addressed and appropriate discharge instructions were distributed by the nursing staff.             Daymon Larsen, MD 03/18/16 2220

## 2016-03-18 NOTE — ED Notes (Signed)
Patient transported to X-ray 

## 2016-03-18 NOTE — Discharge Instructions (Signed)
Nonspecific Chest Pain °It is often hard to find the cause of chest pain. There is always a chance that your pain could be related to something serious, such as a heart attack or a blood clot in your lungs. Chest pain can also be caused by conditions that are not life-threatening. If you have chest pain, it is very important to follow up with your doctor. ° °HOME CARE °· If you were prescribed an antibiotic medicine, finish it all even if you start to feel better. °· Avoid any activities that cause chest pain. °· Do not use any tobacco products, including cigarettes, chewing tobacco, or electronic cigarettes. If you need help quitting, ask your doctor. °· Do not drink alcohol. °· Take medicines only as told by your doctor. °· Keep all follow-up visits as told by your doctor. This is important. This includes any further testing if your chest pain does not go away. °· Your doctor may tell you to keep your head raised (elevated) while you sleep. °· Make lifestyle changes as told by your doctor. These may include: °¨ Getting regular exercise. Ask your doctor to suggest some activities that are safe for you. °¨ Eating a heart-healthy diet. Your doctor or a diet specialist (dietitian) can help you to learn healthy eating options. °¨ Maintaining a healthy weight. °¨ Managing diabetes, if necessary. °¨ Reducing stress. °GET HELP IF: °· Your chest pain does not go away, even after treatment. °· You have a rash with blisters on your chest. °· You have a fever. °GET HELP RIGHT AWAY IF: °· Your chest pain is worse. °· You have an increasing cough, or you cough up blood. °· You have severe belly (abdominal) pain. °· You feel extremely weak. °· You pass out (faint). °· You have chills. °· You have sudden, unexplained chest discomfort. °· You have sudden, unexplained discomfort in your arms, back, neck, or jaw. °· You have shortness of breath at any time. °· You suddenly start to sweat, or your skin gets clammy. °· You feel  nauseous. °· You vomit. °· You suddenly feel light-headed or dizzy. °· Your heart begins to beat quickly, or it feels like it is skipping beats. °These symptoms may be an emergency. Do not wait to see if the symptoms will go away. Get medical help right away. Call your local emergency services (911 in the [place] Do not drive yourself to the hospital. °  °This information is not intended to replace advice given to you by your health care provider. Make sure you discuss any questions you have with your health care provider. °  °Document Released: 04/26/2008 Document Revised: 11/29/2014 Document Reviewed: 06/14/2014 °Elsevier Interactive Patient Education ©2016 Elsevier Inc. ° °Please return immediately if condition worsens. Please contact her primary physician or the physician you were given for referral. If you have any specialist physicians involved in her treatment and plan please also contact them. Thank you for using [facility]. ° °

## 2016-03-18 NOTE — ED Notes (Signed)
Pt awaiting EMS transport back to [facility]

## 2016-03-18 NOTE — ED Notes (Signed)
This RN called lab to inquire about lab results

## 2016-03-18 NOTE — ED Notes (Signed)
Pt from [facility] via EMS, reports he began to have central chest pain after having a bowel movement, denies any other symptoms

## 2016-03-18 NOTE — ED Notes (Signed)
Pt's caregiver # [phone]

## 2016-04-19 ENCOUNTER — Emergency Department: Payer: Medicare Other

## 2016-04-19 ENCOUNTER — Emergency Department
Admission: EM | Admit: 2016-04-19 | Discharge: 2016-04-19 | Disposition: A | Discharge status: Home / Self Care | Payer: Medicare Other | Attending: Student | Admitting: Student

## 2016-04-19 DIAGNOSIS — W1839XA Other fall on same level, initial encounter: Secondary | ICD-10-CM | POA: Diagnosis not present

## 2016-04-19 DIAGNOSIS — Z87891 Personal history of nicotine dependence: Secondary | ICD-10-CM | POA: Diagnosis not present

## 2016-04-19 DIAGNOSIS — N390 Urinary tract infection, site not specified: Secondary | ICD-10-CM | POA: Insufficient documentation

## 2016-04-19 DIAGNOSIS — S8992XA Unspecified injury of left lower leg, initial encounter: Secondary | ICD-10-CM | POA: Diagnosis present

## 2016-04-19 DIAGNOSIS — Y999 Unspecified external cause status: Secondary | ICD-10-CM | POA: Diagnosis not present

## 2016-04-19 DIAGNOSIS — Y929 Unspecified place or not applicable: Secondary | ICD-10-CM | POA: Diagnosis not present

## 2016-04-19 DIAGNOSIS — Y9389 Activity, other specified: Secondary | ICD-10-CM | POA: Insufficient documentation

## 2016-04-19 DIAGNOSIS — Z7982 Long term (current) use of aspirin: Secondary | ICD-10-CM | POA: Insufficient documentation

## 2016-04-19 DIAGNOSIS — I2510 Atherosclerotic heart disease of native coronary artery without angina pectoris: Secondary | ICD-10-CM | POA: Diagnosis not present

## 2016-04-19 DIAGNOSIS — I10 Essential (primary) hypertension: Secondary | ICD-10-CM | POA: Insufficient documentation

## 2016-04-19 DIAGNOSIS — W19XXXA Unspecified fall, initial encounter: Secondary | ICD-10-CM

## 2016-04-19 DIAGNOSIS — S8002XA Contusion of left knee, initial encounter: Secondary | ICD-10-CM | POA: Insufficient documentation

## 2016-04-19 DIAGNOSIS — Z79899 Other long term (current) drug therapy: Secondary | ICD-10-CM | POA: Diagnosis not present

## 2016-04-19 LAB — CBC WITH DIFFERENTIAL/PLATELET
Basophils Absolute: 0 10*3/uL (ref 0–0.1)
Basophils Relative: 1 %
Eosinophils Absolute: 0.2 10*3/uL (ref 0–0.7)
HCT: 34.4 % — ABNORMAL LOW (ref 40.0–52.0)
Hemoglobin: 11.2 g/dL — ABNORMAL LOW (ref 13.0–18.0)
LYMPHS ABS: 2 10*3/uL (ref 1.0–3.6)
MCH: 26.7 pg (ref 26.0–34.0)
MCHC: 32.5 g/dL (ref 32.0–36.0)
MCV: 82.2 fL (ref 80.0–100.0)
MONO ABS: 0.4 10*3/uL (ref 0.2–1.0)
Neutro Abs: 2.9 10*3/uL (ref 1.4–6.5)
Neutrophils Relative %: 52 %
PLATELETS: 171 10*3/uL (ref 150–440)
RBC: 4.18 MIL/uL — ABNORMAL LOW (ref 4.40–5.90)
RDW: 22.1 % — AB (ref 11.5–14.5)
WBC: 5.5 10*3/uL (ref 3.8–10.6)

## 2016-04-19 LAB — BASIC METABOLIC PANEL
Anion gap: 8 (ref 5–15)
BUN: 23 mg/dL — AB (ref 6–20)
CALCIUM: 9.8 mg/dL (ref 8.9–10.3)
CO2: 25 mmol/L (ref 22–32)
Chloride: 107 mmol/L (ref 101–111)
Creatinine, Ser: 1.6 mg/dL — ABNORMAL HIGH (ref 0.61–1.24)
GFR calc Af Amer: 40 mL/min — ABNORMAL LOW (ref >60)
GFR, EST NON AFRICAN AMERICAN: 35 mL/min — AB (ref >60)
GLUCOSE: 103 mg/dL — AB (ref 65–99)
POTASSIUM: 3.8 mmol/L (ref 3.5–5.1)
Sodium: 140 mmol/L (ref 135–145)

## 2016-04-19 LAB — URINALYSIS COMPLETE WITH MICROSCOPIC (ARMC ONLY)
Bilirubin Urine: NEGATIVE
GLUCOSE, UA: NEGATIVE mg/dL
HGB URINE DIPSTICK: NEGATIVE
Ketones, ur: NEGATIVE mg/dL
Nitrite: NEGATIVE
Protein, ur: NEGATIVE mg/dL
SQUAMOUS EPITHELIAL / LPF: NONE SEEN
Specific Gravity, Urine: 1.011 (ref 1.005–1.030)
pH: 7 (ref 5.0–8.0)

## 2016-04-19 LAB — TROPONIN I: Troponin I: <0.03 ng/mL (ref <0.031)

## 2016-04-19 MED ORDER — CEPHALEXIN 500 MG PO CAPS: 500.0000 mg | ORAL_CAPSULE | Freq: Two times a day (BID) | ORAL | Status: DC

## 2016-04-19 MED ORDER — CEPHALEXIN 500 MG PO CAPS
500.0000 mg | ORAL_CAPSULE | Freq: Once | ORAL | Status: AC
  Administered 2016-04-19: 500 mg via ORAL
  Filled 2016-04-19: qty 1

## 2016-04-19 MED ORDER — ACETAMINOPHEN 325 MG PO TABS
650.0000 mg | ORAL_TABLET | Freq: Once | ORAL | Status: AC
  Administered 2016-04-19: 650 mg via ORAL
  Filled 2016-04-19: qty 2

## 2016-04-19 NOTE — ED Provider Notes (Signed)
[facility] Emergency Department Provider Note   ____________________________________________  Time seen: Approximately 11:42 AM  I have reviewed the triage vital signs and the nursing notes.   HISTORY  Chief Complaint Knee Pain    HPI Victor Dillon is a 82 y.o. male with coronary artery disease, hypertension, hyperlipidemia who presents for evaluation of left knee, hip and back pain after a fall yesterday evening, gradual onset, constant, worse with movements, currently moderate. Patient reports that he was washing his pain is at the sink last night when his knees buckled suddenly causing him to collapse to the floor, landing on the left knee. He did not hit his head or lose consciousness. No chest pain or difficulty breathing. No recent illness including no vomiting, diarrhea, fevers or chills.   Past Medical History  Diagnosis Date  . Hypertension   . Hypercholesteremia   . Coronary artery disease   . Murmur, heart     Patient Active Problem List   Diagnosis Date Noted  . Lower GI bleed 12/20/2015  . Rectal bleed 12/15/2015  . Chest pain 07/17/2015    Past Surgical History  Procedure Laterality Date  . Appendectomy      Current Outpatient Rx  Name  Route  Sig  Dispense  Refill  . acetaminophen (TYLENOL) 325 MG tablet   Oral   Take 650 mg by mouth every 4 (four) hours as needed.         Marland Kitchen aspirin EC 81 MG tablet   Oral   Take 81 mg by mouth daily.         . calcium carbonate (CALCIUM 600) 600 MG TABS tablet   Oral   Take 600 mg by mouth 2 (two) times daily.         . cephALEXin (KEFLEX) 500 MG capsule   Oral   Take 1 capsule (500 mg total) by mouth 2 (two) times daily.   20 capsule   0   . Cholecalciferol 10000 units TABS   Oral   Take 1 tablet by mouth daily.         Marland Kitchen docusate sodium (COLACE) 100 MG capsule   Oral   Take 2 capsules (200 mg total) by mouth 2 (two) times daily.   120 capsule   0   .  finasteride (PROPECIA) 1 MG tablet   Oral   Take 1 mg by mouth daily.          . hydrALAZINE (APRESOLINE) 50 MG tablet   Oral   Take 50 mg by mouth 3 (three) times daily.         Marland Kitchen lactulose (CHRONULAC) 10 GM/15ML solution   Oral   Take 45 mLs (30 g total) by mouth 2 (two) times daily as needed for mild constipation.   240 mL   0   . metoprolol succinate (TOPROL XL) 25 MG 24 hr tablet   Oral   Take 1 tablet (25 mg total) by mouth daily.   30 tablet   0   . polyethylene glycol (MIRALAX / GLYCOLAX) packet   Oral   Take 17 g by mouth daily.   14 each   0   . senna (SENOKOT) 8.6 MG TABS tablet   Oral   Take 2 tablets (17.2 mg total) by mouth 2 (two) times daily.   120 each   0   . terazosin (HYTRIN) 5 MG capsule   Oral   Take 5 mg by mouth at  bedtime.           Allergies Review of patient's allergies indicates no known allergies.  No family history on file.  Social History Social History  Substance Use Topics  . Smoking status: Former Research scientist (life sciences)  . Smokeless tobacco: Not on file  . Alcohol Use: No    Review of Systems Constitutional: No fever/chills Eyes: No visual changes. ENT: No sore throat. Cardiovascular: Denies chest pain. Respiratory: Denies shortness of breath. Gastrointestinal: No abdominal pain.  No nausea, no vomiting.  No diarrhea.  No constipation. Genitourinary: Negative for dysuria. Musculoskeletal: Negative for back pain. Skin: Negative for rash. Neurological: Negative for headaches, focal weakness or numbness.  10-point ROS otherwise negative.  ____________________________________________   PHYSICAL EXAM:  VITAL SIGNS: ED Triage Vitals  Enc Vitals Group     BP 04/19/16 1049 180/81 mmHg     Pulse Rate 04/19/16 1049 63     Resp 04/19/16 1100 20     Temp 04/19/16 1049 98.3 F (36.8 C)     Temp Source 04/19/16 1049 Oral     SpO2 04/19/16 1100 100 %     Weight 04/19/16 1049 138 lb (62.596 kg)     Height 04/19/16 1049 6' (1.829  m)     Head Cir --      Peak Flow --      Pain Score 04/19/16 1051 6     Pain Loc --      Pain Edu? --      Excl. in [place]? --     Constitutional: Alert and oriented. Well appearing and in no acute distress. Eyes: Conjunctivae are normal. PERRL. EOMI. Head: Atraumatic. Nose: No congestion/rhinnorhea. Mouth/Throat: Mucous membranes are moist.  Oropharynx non-erythematous. Neck: No stridor.  No cervical spine tenderness to palpation. Cardiovascular: Normal rate, regular rhythm. Grossly normal heart sounds.  Good peripheral circulation. Respiratory: Normal respiratory effort.  No retractions. Lungs CTAB. Gastrointestinal: Soft and nontender. No distention. No CVA tenderness. Genitourinary: deferred Musculoskeletal: Mild tenderness throughout the left knee and hip as well as in the midline of the lower lumbar spine, full range of motion at the left knee and left hip, the left foot is warm and well perfused. Neurologic:  Normal speech and language. No gross focal neurologic deficits are appreciated. Ambulates well with walker. Skin:  Skin is warm, dry and intact. No rash noted. Psychiatric: Mood and affect are normal. Speech and behavior are normal.  ____________________________________________   LABS (all labs ordered are listed, but only abnormal results are displayed)  Labs Reviewed  CBC WITH DIFFERENTIAL/PLATELET - Abnormal; Notable for the following:    RBC 4.18 (*)    Hemoglobin 11.2 (*)    HCT 34.4 (*)    RDW 22.1 (*)    All other components within normal limits  BASIC METABOLIC PANEL - Abnormal; Notable for the following:    Glucose, Bld 103 (*)    BUN 23 (*)    Creatinine, Ser 1.60 (*)    GFR calc non Af Amer 35 (*)    GFR calc Af Amer 40 (*)    All other components within normal limits  URINALYSIS COMPLETEWITH MICROSCOPIC ([facility] ONLY) - Abnormal; Notable for the following:    Color, Urine YELLOW (*)    APPearance CLEAR (*)    Leukocytes, UA 2+ (*)    Bacteria, UA MANY  (*)    All other components within normal limits  URINE CULTURE  TROPONIN I   ____________________________________________  EKG  ED ECG  REPORT I, Joanne Gavel, the attending physician, personally viewed and interpreted this ECG.   Date: 04/19/2016  EKG Time: 11:51  Rate: 53  Rhythm: sinus bradycardia  Axis: normal  Intervals:none  ST&T Change: No acute ST elevation. T-wave inversions in lead 3, aVF. LVH. Borderline prolonged PR interval. ____________________________________________  RADIOLOGY  Xray left knee  IMPRESSION: No fracture, joint effusion or malalignment in the left knee.  Xray left hip IMPRESSION: No acute abnormality noted.  Xray lumbar spine IMPRESSION: Degenerative changes without acute abnormality.  ____________________________________________   PROCEDURES  Procedure(s) performed: None  Critical Care performed: No  ____________________________________________   INITIAL IMPRESSION / ASSESSMENT AND PLAN / ED COURSE  Pertinent labs & imaging results that were available during my care of the patient were reviewed by me and considered in my medical decision making (see chart for details).  Victor Dillon is a 82 y.o. male with coronary artery disease, hypertension, hyperlipidemia who presents for evaluation of left knee, hip and back pain after a fall yesterday evening. On exam, he is very well-appearing and in no acute distress, vital signs stable, he is afebrile. He has a nonfocal neurological examination. His injuries appear to be minor and his [place] point isolated to the left hip, left knee and the lumbar spine which we will x-ray. We will obtain screening labs, treat his pain, reassess for disposition.   ----------------------------------------- 2:33 PM on 04/19/2016 ----------------------------------------- I reviewed the patient's labs and imaging. CBC with mild anemia over this appears chronic. Creatinine is also mildly elevated at  1.60 but this appears to be near the patient's baseline. Negative troponin. Imaging negative for any acute traumatic pathology. Urinalysis is concerning for urinary tract infection, we'll treat with Keflex. The patient ambulates well with a walker, DC with return precautions and close PCP follow-up. He is comfortable with the discharge plan.  ____________________________________________   FINAL CLINICAL IMPRESSION(S) / ED DIAGNOSES  Final diagnoses:  Fall, initial encounter  UTI (lower urinary tract infection)  Knee contusion, left, initial encounter      NEW MEDICATIONS STARTED DURING THIS VISIT:  Discharge Medication List as of 04/19/2016  2:38 PM    START taking these medications   Details  cephALEXin (KEFLEX) 500 MG capsule Take 1 capsule (500 mg total) by mouth 2 (two) times daily., Starting 04/19/2016, Until Discontinued, Print         Note:  This document was prepared using Dragon voice recognition software and may include unintentional dictation errors.    Joanne Gavel, MD 04/19/16 2102

## 2016-04-19 NOTE — ED Notes (Signed)
Ems from [facility] c/o left knee and back pain s/p fall yesterday. Pt states that stood up and knees gave out. Denies dizziness or syncope.

## 2016-04-21 LAB — URINE CULTURE

## 2016-05-25 ENCOUNTER — Emergency Department
Admission: EM | Admit: 2016-05-25 | Discharge: 2016-05-25 | Disposition: A | Discharge status: Home / Self Care | Payer: Medicare Other | Attending: Emergency Medicine | Admitting: Emergency Medicine

## 2016-05-25 DIAGNOSIS — K5641 Fecal impaction: Secondary | ICD-10-CM

## 2016-05-25 DIAGNOSIS — K5901 Slow transit constipation: Secondary | ICD-10-CM | POA: Insufficient documentation

## 2016-05-25 DIAGNOSIS — M545 Low back pain: Secondary | ICD-10-CM | POA: Diagnosis present

## 2016-05-25 DIAGNOSIS — Z87891 Personal history of nicotine dependence: Secondary | ICD-10-CM | POA: Diagnosis not present

## 2016-05-25 DIAGNOSIS — Z7982 Long term (current) use of aspirin: Secondary | ICD-10-CM | POA: Insufficient documentation

## 2016-05-25 DIAGNOSIS — I2510 Atherosclerotic heart disease of native coronary artery without angina pectoris: Secondary | ICD-10-CM | POA: Insufficient documentation

## 2016-05-25 DIAGNOSIS — I10 Essential (primary) hypertension: Secondary | ICD-10-CM | POA: Diagnosis not present

## 2016-05-25 DIAGNOSIS — Z79899 Other long term (current) drug therapy: Secondary | ICD-10-CM | POA: Insufficient documentation

## 2016-05-25 LAB — URINALYSIS COMPLETE WITH MICROSCOPIC (ARMC ONLY)
BILIRUBIN URINE: NEGATIVE
GLUCOSE, UA: NEGATIVE mg/dL
HGB URINE DIPSTICK: NEGATIVE
Ketones, ur: NEGATIVE mg/dL
NITRITE: NEGATIVE
PH: 6 (ref 5.0–8.0)
Protein, ur: 30 mg/dL — AB
SPECIFIC GRAVITY, URINE: 1.016 (ref 1.005–1.030)

## 2016-05-25 LAB — COMPREHENSIVE METABOLIC PANEL
ALT: 11 U/L — AB (ref 17–63)
AST: 24 U/L (ref 15–41)
Albumin: 4.4 g/dL (ref 3.5–5.0)
Alkaline Phosphatase: 65 U/L (ref 38–126)
Anion gap: 5 (ref 5–15)
BILIRUBIN TOTAL: 0.5 mg/dL (ref 0.3–1.2)
BUN: 25 mg/dL — ABNORMAL HIGH (ref 6–20)
CHLORIDE: 109 mmol/L (ref 101–111)
CO2: 27 mmol/L (ref 22–32)
CREATININE: 1.39 mg/dL — AB (ref 0.61–1.24)
Calcium: 10 mg/dL (ref 8.9–10.3)
GFR, EST AFRICAN AMERICAN: 47 mL/min — AB (ref >60)
GFR, EST NON AFRICAN AMERICAN: 41 mL/min — AB (ref >60)
Glucose, Bld: 104 mg/dL — ABNORMAL HIGH (ref 65–99)
Potassium: 4 mmol/L (ref 3.5–5.1)
Sodium: 141 mmol/L (ref 135–145)
TOTAL PROTEIN: 7.9 g/dL (ref 6.5–8.1)

## 2016-05-25 LAB — CBC WITH DIFFERENTIAL/PLATELET
Basophils Absolute: 0.1 10*3/uL (ref 0–0.1)
Basophils Relative: 2 %
EOS PCT: 3 %
Eosinophils Absolute: 0.2 10*3/uL (ref 0–0.7)
HEMATOCRIT: 34.7 % — AB (ref 40.0–52.0)
Hemoglobin: 11.6 g/dL — ABNORMAL LOW (ref 13.0–18.0)
LYMPHS ABS: 1.3 10*3/uL (ref 1.0–3.6)
LYMPHS PCT: 22 %
MCH: 28.3 pg (ref 26.0–34.0)
MCHC: 33.4 g/dL (ref 32.0–36.0)
MCV: 84.7 fL (ref 80.0–100.0)
MONO ABS: 0.4 10*3/uL (ref 0.2–1.0)
Monocytes Relative: 6 %
NEUTROS ABS: 4 10*3/uL (ref 1.4–6.5)
Neutrophils Relative %: 67 %
PLATELETS: 251 10*3/uL (ref 150–440)
RBC: 4.09 MIL/uL — AB (ref 4.40–5.90)
RDW: 21.9 % — AB (ref 11.5–14.5)
WBC: 5.9 10*3/uL (ref 3.8–10.6)

## 2016-05-25 MED ORDER — FLEET ENEMA 7-19 GM/118ML RE ENEM
1.0000 | ENEMA | Freq: Once | RECTAL | Status: AC
  Administered 2016-05-25: 1 via RECTAL

## 2016-05-25 MED ORDER — POLYETHYLENE GLYCOL 3350 17 G PO PACK: 17.0000 g | PACK | Freq: Every day | ORAL | Status: DC

## 2016-05-25 NOTE — ED Notes (Signed)
Pt states that he lives independently in housing has 2 caregivers that come over in the morning to help him but is by himself for most of the day. Pt states that he has rode on the [facility] bus multiple times for errands, etc. Pt states he is comfortable with riding but home. Offered to call patient caregiver to update on course of treatment if patient would like, pt denies and states that he is able to tell them on his own and get prescription filled. Pt assisted to stand; pt able to take steps on his own and dress himself. Pt now in bed awaiting bus arrival, watching TV. Pt offered food but states that he is not hungry. Prescriptions, discharge papers and pt DNR at his bedside.

## 2016-05-25 NOTE — ED Provider Notes (Signed)
[facility] Emergency Department Provider Note        Time seen: ----------------------------------------- 10:28 AM on 05/25/2016 -----------------------------------------    I have reviewed the triage vital signs and the nursing notes.   HISTORY  Chief Complaint Back Pain    HPI Victor Dillon is a 82 y.o. male who presents to ER with left lower and left-sided back pain. Patient states the real reason he called EMS is because he is constipated. Patient states been 10 days she's had a bowel movement. He is recently diagnosed and treated for UTI, finished taking antibiotics today. He denies any recent fall, but fell 2 weeks ago.   Past Medical History  Diagnosis Date  . Hypertension   . Hypercholesteremia   . Coronary artery disease   . Murmur, heart     Patient Active Problem List   Diagnosis Date Noted  . Lower GI bleed 12/20/2015  . Rectal bleed 12/15/2015  . Chest pain 07/17/2015    Past Surgical History  Procedure Laterality Date  . Appendectomy      Allergies Review of patient's allergies indicates no known allergies.  Social History Social History  Substance Use Topics  . Smoking status: Former Research scientist (life sciences)  . Smokeless tobacco: None  . Alcohol Use: No    Review of Systems Constitutional: Negative for fever. Eyes: Negative for visual changes. ENT: Negative for sore throat. Cardiovascular: Negative for chest pain. Respiratory: Negative for shortness of breath. Gastrointestinal: Negative for abdominal pain, vomiting and diarrhea.Positive for constipation Genitourinary: Negative for dysuria. Musculoskeletal: Positive for left-sided low back pain Skin: Negative for rash. Neurological: Negative for headaches, focal weakness or numbness.  10-point ROS otherwise negative.  ____________________________________________   PHYSICAL EXAM:  VITAL SIGNS: ED Triage Vitals  Enc Vitals Group     BP 05/25/16 1025 192/77 mmHg   Pulse Rate 05/25/16 1025 70     Resp 05/25/16 1025 21     Temp 05/25/16 1025 98.3 F (36.8 C)     Temp Source 05/25/16 1025 Oral     SpO2 05/25/16 1025 99 %     Weight 05/25/16 1025 139 lb (63.05 kg)     Height 05/25/16 1025 5\' 8"  (1.727 m)     Head Cir --      Peak Flow --      Pain Score 05/25/16 1026 8     Pain Loc --      Pain Edu? --      Excl. in [place]? --     Constitutional: Alert and oriented. Well appearing and in no distress. Eyes: Conjunctivae are normal. Normal extraocular movements. Cardiovascular: Normal rate, regular rhythm. No murmurs, rubs, or gallops. Respiratory: Normal respiratory effort without tachypnea nor retractions. Breath sounds are clear and equal bilaterally. No wheezes/rales/rhonchi. Gastrointestinal: Soft and nontender. Normal bowel sounds Rectal: Patient with small hard stool high in the rectal vault Musculoskeletal: Specific left-sided paraspinous muscle tenderness in the lumbar sacral spine Neurologic:  Normal speech and language. No gross focal neurologic deficits are appreciated.  Skin:  Skin is warm, dry and intact. No rash noted. Psychiatric: Mood and affect are normal. Speech and behavior are normal.  ____________________________________________  ED COURSE:  Pertinent labs & imaging results that were available during my care of the patient were reviewed by me and considered in my medical decision making (see chart for details). Patient presents to ER with left-sided low back pain. We'll check basic labs and imaging. I will also attempt to disimpact ____________________________________________  LABS (pertinent positives/negatives)  Labs Reviewed  CBC WITH DIFFERENTIAL/PLATELET - Abnormal; Notable for the following:    RBC 4.09 (*)    Hemoglobin 11.6 (*)    HCT 34.7 (*)    RDW 21.9 (*)    All other components within normal limits  COMPREHENSIVE METABOLIC PANEL - Abnormal; Notable for the following:    Glucose, Bld 104 (*)    BUN 25 (*)     Creatinine, Ser 1.39 (*)    ALT 11 (*)    GFR calc non Af Amer 41 (*)    GFR calc Af Amer 47 (*)    All other components within normal limits  URINALYSIS COMPLETEWITH MICROSCOPIC ([facility] ONLY) - Abnormal; Notable for the following:    Color, Urine YELLOW (*)    APPearance CLEAR (*)    Protein, ur 30 (*)    Leukocytes, UA TRACE (*)    Bacteria, UA RARE (*)    Squamous Epithelial / LPF 0-5 (*)    All other components within normal limits   ____________________________________________  FINAL ASSESSMENT AND PLAN  Low back pain, constipation  Plan: Patient with labs and imaging as dictated above. Patient felt better after a fleets enema here. He passed a large stool and his symptoms have resolved. Otherwise his labs and workup have been within normal limits and consistent with prior evaluations. He is advised to have close follow-up with his doctor as needed.   Earleen Newport, MD   Note: This dictation was prepared with Dragon dictation. Any transcriptional errors that result from this process are unintentional   Earleen Newport, MD 05/25/16 1236

## 2016-05-25 NOTE — ED Notes (Signed)
Pt did get all of his morning medications before arrival to ER per his report.

## 2016-05-25 NOTE — Discharge Instructions (Signed)
Colostomy Surgery, Adult °A colostomy surgery is a procedure that redirects a section of the large intestine (colon) to an opening in the abdomen. This opening is called a stoma or ostomy. A bag is attached to the stoma on the outside of the body. This bag collects waste, since the waste can no longer travel through the rest of the colon. Where the stoma is located and what it looks like depends on the type of colostomy performed. A colostomy may be temporary or permanent. The hospital stay after this procedure is typically 3-7 days. °LET YOUR HEALTH CARE PROVIDER KNOW ABOUT: °· Allergies to food or medicine. °· Medicines taken, including vitamins, health supplements, herbs, eye drops, over-the-counter medicines, and creams. °· Use of steroids (by mouth or creams). °· Previous problems with anesthetics or numbing medicines. °· History of bleeding problems or blood clots. °· Previous surgery. °· Other health problems, including diabetes and kidney problems. °· Possibility of pregnancy, if this applies. °RISKS AND COMPLICATIONS °General surgical complications may include: °· Reaction to anesthetics. °· Damage to surrounding nerves, tissues, or structures. °· Infection. °· Blood clot. °· Bleeding. °· Scarring. °· Pain that lasts longer than 3 months. °Specific risks for colostomy, while rare, may include: °· Intestinal blockage. °· Skin irritation. °· Wound opening. °· Narrowing or collapsing of the stoma. °· Hernia. °BEFORE THE PROCEDURE °It is important to follow your health care provider's instructions prior to your procedure to avoid complications. Steps before your procedure may include: °· A physical exam, blood and urine tests, stool test, X-rays, and other procedures. °· Chemotherapy or radiation therapy. °· A review of the procedure, the anesthetic being used, and what to expect after the procedure. You may meet with an ostomy advisor. °You may be asked to: °· Stop taking certain medicines for several days  prior to your procedure such as blood thinners (including aspirin). °· Take certain medicines, such as antibiotics or stool softeners. °· Follow a special diet for several days prior to the procedure and to avoid eating and drinking after midnight the night before the procedure. This will help you to avoid complications from the anesthetic. °· Take an antibacterial shower the night before, or the morning of, the procedure. °· Quit smoking. Smoking increases the chances of an infection or a healing problem after your procedure. °Arrange for someone to drive you home after surgery. You should also arrange to have someone help you with activities while you recover. °PROCEDURE °There are several types of colostomy procedures. The 2 main procedure types are loop colostomy or end colostomy. During a loop colostomy, the surgeon pulls 2 ends of the intestine out toward the abdomen, using 2 openings. During an end colostomy, the surgeon pulls 1 end of the intestine out toward the abdomen, through 1 opening. You will be given medicine that makes you sleep (general anesthetic). The procedure may be done as open surgery, with a large cut (incision), or as laparoscopic surgery, with several small incisions. °AFTER THE PROCEDURE °· You will be given pain medicine. °· You may be able to suck on ice. You may begin drinking clear fluids the next day and begin a normal diet after 2 days, or as directed by your health care provider. °· Your stoma will be covered with bandages or a pouch. °· Initial drainage from the stoma will be liquid. °· The stoma may be dark-colored, swollen, and bruised until it has more time to heal. °  °This information is not intended to replace advice given   to you by your health care provider. Make sure you discuss any questions you have with your health care provider.   Document Released: 03/31/2011 Document Revised: 03/25/2015 Document Reviewed: 03/31/2011 Elsevier Interactive Patient Education 2016  [place].  Fecal Impaction A fecal impaction happens when there is a large, firm amount of stool (or feces) that cannot be passed. The impacted stool is usually in the rectum, which is the lowest part of the large bowel. The impacted stool can block the colon and cause significant problems. CAUSES  The longer stool stays in the rectum, the harder it gets. Anything that slows down your bowel movements can lead to fecal impaction, such as:  Constipation. This can be a long-standing (chronic) problem or can happen suddenly (acute).  Painful conditions of the rectum, such as hemorrhoids or anal fissures. The pain of these conditions can make you try to avoid having bowel movements.  Narcotic pain-relieving medicines, such as methadone, morphine, or codeine.  Not drinking enough fluids.  Inactivity and bed rest over long periods of time.  Diseases of the brain or nervous system that damage the nerves controlling the muscles of the intestines. SIGNS AND SYMPTOMS   Lack of normal bowel movements or changes in bowel patterns.  Sense of fullness in the rectum but unable to pass stool.  Pain or cramps in the abdominal area (often after meals).  Thin, watery discharge from the rectum. DIAGNOSIS  Your health care provider may suspect that you have a fecal impaction based on your symptoms and a physical exam. This will include an exam of your rectum. Sometimes X-rays or lab testing may be needed to confirm the diagnosis and to be sure there are no other problems.  TREATMENT   Initially an impaction can be removed manually. Using a gloved finger, your health care provider can remove hard stool from your rectum.  Medicine is sometimes needed. A suppository or enema can be given in the rectum to soften the stool, which can stimulate a bowel movement. Medicines can also be given by mouth (orally).  Though rare, surgery may be needed if the colon has torn (perforated) due to blockage. HOME CARE  INSTRUCTIONS   Develop regular bowel habits. This could include getting in the habit of having a bowel movement after your morning cup of coffee or after eating. Be sure to allow yourself enough time on the toilet.  Maintain a high-fiber diet.  Drink enough fluids to keep your urine clear or pale yellow as directed by your health care provider.  Exercise regularly.  If you begin to get constipated, increase the amount of fiber in your diet. Eat plenty of fruits, vegetables, whole wheat breads, bran, oatmeal, and similar products.  Take natural fiber laxatives or other laxatives only as directed by your health care provider. SEEK MEDICAL CARE IF:   You have ongoing rectal pain.  You require enemas or suppositories more than twice a week.  You have rectal bleeding.  You have continued problems, or you develop abdominal pain.  You have thin, pencil-like stools. SEEK IMMEDIATE MEDICAL CARE IF:  You have black or tarry stools. MAKE SURE YOU:   Understand these instructions.  Will watch your condition.  Will get help right away if you are not doing well or get worse.   This information is not intended to replace advice given to you by your health care provider. Make sure you discuss any questions you have with your health care provider.   Document Released:  07/31/2004 Document Revised: 08/29/2013 Document Reviewed: 05/15/2013 Elsevier Interactive Patient Education [employer].

## 2016-05-25 NOTE — ED Notes (Addendum)
Pt was able to defecate a small amount of light brown liquid. Pt continues to hold the rest of enema fluid in.  Pt unable to urinate at this time.

## 2016-05-25 NOTE — ED Notes (Signed)
MD at bedside. 

## 2016-05-25 NOTE — ED Notes (Signed)
Pt given ginger ale and repositioned in bed. Denies further needs at this time. Lights dimmed, call bell within reach

## 2016-05-25 NOTE — ED Notes (Signed)
Spoke with Victor Dillon at [facility] who states that it is independent living and they do not receive report back. States that he will send bus driver over to ER to pick up patient.

## 2016-05-25 NOTE — ED Notes (Signed)
Pt alert and oriented X4, active, cooperative, pt in NAD. RR even and unlabored, color WNL.  Pt informed to return if any life threatening symptoms occur.  Pt wheeled out to [facility] in wheelchair, assisted into [place].

## 2016-05-25 NOTE — ED Notes (Signed)
Pt arrives to ER via [facility] from [facility] facility c/o lower and left sided back pain. Pt recently dx and treated for UTI; finished antibiotics today. Denies injury or falls. Pt able stand but causes pain. Pt alert and oriented X4, active, cooperative, pt in NAD. RR even and unlabored, color WNL.

## 2016-05-25 NOTE — ED Notes (Addendum)
Attempt for IV access by this RN X 1 unsuccessful.

## 2016-05-25 NOTE — ED Notes (Signed)
Pt had large BM. Pericare performed. Pt states that he feels better. Found IV to be out of patient hand and in bed, bleeding stopped at this time. Pt states that it got hung.

## 2016-05-25 NOTE — ED Notes (Addendum)
No deformity to either leg/hip. Pt unable to have BM x 1 days per his report.

## 2016-06-11 ENCOUNTER — Emergency Department: Payer: Medicare Other

## 2016-06-11 ENCOUNTER — Encounter: Payer: Self-pay | Admitting: Medical Oncology

## 2016-06-11 ENCOUNTER — Emergency Department
Admission: EM | Admit: 2016-06-11 | Discharge: 2016-06-11 | Disposition: A | Discharge status: Home / Self Care | Payer: Medicare Other | Attending: Emergency Medicine | Admitting: Emergency Medicine

## 2016-06-11 DIAGNOSIS — Z79899 Other long term (current) drug therapy: Secondary | ICD-10-CM | POA: Insufficient documentation

## 2016-06-11 DIAGNOSIS — M79605 Pain in left leg: Secondary | ICD-10-CM

## 2016-06-11 DIAGNOSIS — Z87891 Personal history of nicotine dependence: Secondary | ICD-10-CM | POA: Insufficient documentation

## 2016-06-11 DIAGNOSIS — I10 Essential (primary) hypertension: Secondary | ICD-10-CM | POA: Insufficient documentation

## 2016-06-11 DIAGNOSIS — I2510 Atherosclerotic heart disease of native coronary artery without angina pectoris: Secondary | ICD-10-CM | POA: Diagnosis not present

## 2016-06-11 DIAGNOSIS — M545 Low back pain: Secondary | ICD-10-CM

## 2016-06-11 DIAGNOSIS — Z7982 Long term (current) use of aspirin: Secondary | ICD-10-CM | POA: Diagnosis not present

## 2016-06-11 DIAGNOSIS — M5416 Radiculopathy, lumbar region: Secondary | ICD-10-CM | POA: Insufficient documentation

## 2016-06-11 DIAGNOSIS — G8929 Other chronic pain: Secondary | ICD-10-CM

## 2016-06-11 LAB — CBC
HCT: 34.3 % — ABNORMAL LOW (ref 40.0–52.0)
HEMOGLOBIN: 11.6 g/dL — AB (ref 13.0–18.0)
MCH: 29.1 pg (ref 26.0–34.0)
MCHC: 33.7 g/dL (ref 32.0–36.0)
MCV: 86.3 fL (ref 80.0–100.0)
Platelets: 226 10*3/uL (ref 150–440)
RBC: 3.98 MIL/uL — ABNORMAL LOW (ref 4.40–5.90)
RDW: 21.8 % — AB (ref 11.5–14.5)
WBC: 5.5 10*3/uL (ref 3.8–10.6)

## 2016-06-11 LAB — BASIC METABOLIC PANEL
ANION GAP: 5 (ref 5–15)
BUN: 27 mg/dL — ABNORMAL HIGH (ref 6–20)
CALCIUM: 10.2 mg/dL (ref 8.9–10.3)
CO2: 26 mmol/L (ref 22–32)
Chloride: 111 mmol/L (ref 101–111)
Creatinine, Ser: 1.66 mg/dL — ABNORMAL HIGH (ref 0.61–1.24)
GFR calc Af Amer: 38 mL/min — ABNORMAL LOW (ref >60)
GFR, EST NON AFRICAN AMERICAN: 33 mL/min — AB (ref >60)
GLUCOSE: 103 mg/dL — AB (ref 65–99)
Potassium: 3.8 mmol/L (ref 3.5–5.1)
SODIUM: 142 mmol/L (ref 135–145)

## 2016-06-11 LAB — URINALYSIS COMPLETE WITH MICROSCOPIC (ARMC ONLY)
BACTERIA UA: NONE SEEN
Bilirubin Urine: NEGATIVE
Glucose, UA: NEGATIVE mg/dL
Hgb urine dipstick: NEGATIVE
KETONES UR: NEGATIVE mg/dL
Nitrite: NEGATIVE
PH: 5 (ref 5.0–8.0)
PROTEIN: NEGATIVE mg/dL
SQUAMOUS EPITHELIAL / LPF: NONE SEEN
Specific Gravity, Urine: 1.016 (ref 1.005–1.030)

## 2016-06-11 MED ORDER — ACETAMINOPHEN 500 MG PO TABS
ORAL_TABLET | ORAL | Status: AC
  Administered 2016-06-11: 1000 mg
  Filled 2016-06-11: qty 2

## 2016-06-11 NOTE — ED Notes (Signed)
Patient's caregiver and legal guardian both called in regards to transporting patient back to his facility after discharge.  Neither are able to transport patient and would like him sent back by EMS.  Secretary notified of EMS transportation needed.

## 2016-06-11 NOTE — ED Provider Notes (Signed)
[facility] Emergency Department Provider Note  ____________________________________________  Time seen: Approximately 11:18 AM  I have reviewed the triage vital signs and the nursing notes.   HISTORY  Chief Complaint Back Pain    HPI Victor Dillon is a 82 y.o. male reports he's been having pain in his left lower back off and on for years. He reports that for the last couple of months the pain has been persistent, and seems to be slowly worsening with a sharp pain starting in his left lower back that radiates down to the back of his left thigh. He is able to walk, and denies any numbness tingling or weakness in the leg but states it's very painful. He has not taken any pain medication today, reports he's had the same symptoms off and on for quite some time.  He used to work in [employer] in [employer], he tells me he thinks he has bad arthritis potentially his back. He has not had any changes in his bowel or bladder. He had constipation a few weeks ago, but states this seems to be better and is having normal bowel movements again. No nausea vomiting or abdominal pain.  Reports a moderate to severe sharp pain in the left lower back. When sitting still he is comfortable, but when he gets up to move he reports the pain can be severe.  Past Medical History  Diagnosis Date  . Hypertension   . Hypercholesteremia   . Coronary artery disease   . Murmur, heart   GI bleed Demand ischemia  Patient Active Problem List   Diagnosis Date Noted  . Lower GI bleed 12/20/2015  . Rectal bleed 12/15/2015  . Chest pain 07/17/2015    Past Surgical History  Procedure Laterality Date  . Appendectomy      Current Outpatient Rx  Name  Route  Sig  Dispense  Refill  . acetaminophen (TYLENOL) 325 MG tablet   Oral   Take 650 mg by mouth every 4 (four) hours as needed.         Marland Kitchen aspirin EC 81 MG tablet   Oral   Take 81 mg by mouth daily.         . calcium carbonate  (CALCIUM 600) 600 MG TABS tablet   Oral   Take 600 mg by mouth 2 (two) times daily.         . cephALEXin (KEFLEX) 500 MG capsule   Oral   Take 1 capsule (500 mg total) by mouth 2 (two) times daily.   20 capsule   0   . Cholecalciferol 10000 units TABS   Oral   Take 1 tablet by mouth daily.         Marland Kitchen docusate sodium (COLACE) 100 MG capsule   Oral   Take 2 capsules (200 mg total) by mouth 2 (two) times daily.   120 capsule   0   . finasteride (PROPECIA) 1 MG tablet   Oral   Take 1 mg by mouth daily.          . hydrALAZINE (APRESOLINE) 50 MG tablet   Oral   Take 50 mg by mouth 3 (three) times daily.         Marland Kitchen lactulose (CHRONULAC) 10 GM/15ML solution   Oral   Take 45 mLs (30 g total) by mouth 2 (two) times daily as needed for mild constipation.   240 mL   0   . metoprolol succinate (TOPROL XL) 25 MG 24  hr tablet   Oral   Take 1 tablet (25 mg total) by mouth daily.   30 tablet   0   . polyethylene glycol (MIRALAX / GLYCOLAX) packet   Oral   Take 17 g by mouth daily.   14 each   0   . polyethylene glycol (MIRALAX / GLYCOLAX) packet   Oral   Take 17 g by mouth daily.   14 each   0   . senna (SENOKOT) 8.6 MG TABS tablet   Oral   Take 2 tablets (17.2 mg total) by mouth 2 (two) times daily.   120 each   0   . terazosin (HYTRIN) 5 MG capsule   Oral   Take 5 mg by mouth at bedtime.           Allergies Review of patient's allergies indicates no known allergies.  No family history on file.  Social History Social History  Substance Use Topics  . Smoking status: Former Research scientist (life sciences)  . Smokeless tobacco: None  . Alcohol Use: No    Review of Systems Constitutional: No fever/chills Eyes: No visual changes. ENT: No sore throat. Cardiovascular: Denies chest pain. Respiratory: Denies shortness of breath. Gastrointestinal: No abdominal pain.  No nausea, no vomiting.  No diarrhea.  No constipation. Genitourinary: Negative for dysuria. Musculoskeletal:  See history of present illness Skin: Negative for rash. Neurological: Negative for headaches, focal weakness or numbness.  10-point ROS otherwise negative.  ____________________________________________   PHYSICAL EXAM:  VITAL SIGNS: ED Triage Vitals  Enc Vitals Group     BP 06/11/16 0927 179/90 mmHg     Pulse Rate 06/11/16 0927 69     Resp 06/11/16 0927 20     Temp 06/11/16 0927 97.5 F (36.4 C)     Temp Source 06/11/16 0927 Oral     SpO2 06/11/16 0927 100 %     Weight 06/11/16 0927 136 lb (61.689 kg)     Height 06/11/16 0927 5\' 7"  (1.702 m)     Head Cir --      Peak Flow --      Pain Score 06/11/16 0928 7     Pain Loc --      Pain Edu? --      Excl. in [place]? --    Constitutional: Alert and oriented. Well appearing and in no acute distress.Pleasant. Resting comfortably Eyes: Conjunctivae are normal. PERRL. EOMI. Head: Atraumatic. Nose: No congestion/rhinnorhea. Mouth/Throat: Mucous membranes are moist.  Oropharynx non-erythematous. Neck: No stridor.   Cardiovascular: Normal rate, regular rhythm. Strong dorsalis pedis pulses bilateral. Good peripheral circulation. Respiratory: Normal respiratory effort.  Lungs CTAB. Gastrointestinal: Soft and nontender. No distention.  Musculoskeletal:   Lower Extremities  No edema. Normal DP/PT pulses bilateral with good cap refill.  Normal neuro-motor function lower extremities bilateral. There is some limitation and use of the left leg due to pain in his lower back when he attempts to flex at the left hip.  RIGHT Right lower extremity demonstrates normal strength, good use of all muscles. No edema bruising or contusions of the right hip, right knee, right ankle. Full range of motion of the right lower extremity without pain. No pain on axial loading. No evidence of trauma.  LEFT Left lower extremity demonstrates normal strength, good use of all muscles though pain is elevated in his left lower back when he tries to raise and lower  the left leg. No edema bruising or contusions of the hip,  knee, ankle. Full range of  motion of the left lower extremity without pain. No pain on axial loading. No evidence of trauma.  Patient examined with nurse Delilah Shan. There is no saddle anesthesia.  Neurologic:  Normal speech and language. No gross focal neurologic deficits are appreciated. Skin:  Skin is warm, dry and intact. No rash noted. Psychiatric: Mood and affect are normal. Speech and behavior are normal.  ____________________________________________   LABS (all labs ordered are listed, but only abnormal results are displayed)  Labs Reviewed  URINALYSIS COMPLETEWITH MICROSCOPIC ([facility] ONLY) - Abnormal; Notable for the following:    Color, Urine YELLOW (*)    APPearance CLEAR (*)    Leukocytes, UA 1+ (*)    All other components within normal limits  BASIC METABOLIC PANEL - Abnormal; Notable for the following:    Glucose, Bld 103 (*)    BUN 27 (*)    Creatinine, Ser 1.66 (*)    GFR calc non Af Amer 33 (*)    GFR calc Af Amer 38 (*)    All other components within normal limits  CBC - Abnormal; Notable for the following:    RBC 3.98 (*)    Hemoglobin 11.6 (*)    HCT 34.3 (*)    RDW 21.8 (*)    All other components within normal limits   ____________________________________________  EKG  Reviewed and interpreted by me at 9:40 AM Richard rate 70 Care is 99 QTc 4:30 Reviewed and interpreted as normal sinus rhythm, no evidence of acute ischemic abnormality. Probable lead reversal ____________________________________________  RADIOLOGY   [place] Retroperitoneal Comp (Final result) Result time: 06/11/16 12:32:55   Procedure changed from US Aorta      Final result by Rad Results In Interface (06/11/16 12:32:55)   Narrative:   CLINICAL DATA: Low back pain radiating to left lower extremity x2 days  EXAM: ULTRASOUND RETROPERITONEAL COMPLETE  TECHNIQUE: Ultrasound examination of the abdominal aorta was performed  to evaluate for abdominal aortic aneurysm. The common iliac arteries, IVC, and kidneys were also evaluated.  COMPARISON: CT abdomen pelvis dated 12/20/2015  FINDINGS: Abdominal Aorta  No aneurysm identified.  Maximum AP  Diameter: 2.7 cm  Maximum TRV  Diameter: 2.5 cm  Right Common Iliac Artery  No aneurysm identified.  Left Common Iliac Artery  No aneurysm identified.  IVC  No abnormality visualized.  Right Kidney  Length: 9.9 cm Multiple renal cysts, measuring up to 7.0 x 4.9 x 4.4 cm in the upper pole. No hydronephrosis.  Left Kidney  Length: 12.4 cm multiple cysts, measuring up to 7.1 x 5.0 x 6.2 cm in the upper pole. No hydronephrosis.  IMPRESSION: No evidence of abdominal aortic aneurysm.  Multiple bilateral renal cysts, measuring up to 7.1 cm on the left. No hydronephrosis.   Electronically Signed By: Julian Hy M.D. On: 06/11/2016 12:32          DG Abd 2 Views (Final result) Result time: 06/11/16 12:26:31   Final result by Rad Results In Interface (06/11/16 12:26:31)   Narrative:   CLINICAL DATA: Severe left lower flank pain.  EXAM: ABDOMEN - 2 VIEW  COMPARISON: Radiograph of December 15, 2015.  FINDINGS: The bowel gas pattern is normal. There is no evidence of free air. No radio-opaque calculi or other significant radiographic abnormality is seen.  IMPRESSION: No evidence of bowel obstruction or ileus.   Electronically Signed By: Marijo Conception, M.D. On: 06/11/2016 12:26          DG Lumbar Spine 2-3 Views (Final result) Result time:  06/11/16 12:33:42   Final result by Rad Results In Interface (06/11/16 12:33:42)   Narrative:   CLINICAL DATA: Lumbago with left-sided radicular symptoms  EXAM: LUMBAR SPINE - 2-3 VIEW  COMPARISON: Apr 19, 2016  FINDINGS: Frontal and mildly oblique lateral view obtained. The there are 5 non-rib-bearing lumbar type vertebral bodies. There is mild  rotatory scoliosis. There is no fracture or spondylolisthesis. There is disc space narrowing at L3-4 and L5-S1, stable. There are no erosions. There is atherosclerotic calcification in the aorta.  IMPRESSION: Areas of arthropathy, not felt to be appreciably changed. No fracture or spondylolisthesis. Aortic atherosclerosis.   Electronically Signed By: Lowella Grip III M.D. On: 06/11/2016 12:33          DG HIP UNILAT WITH PELVIS 2-3 VIEWS LEFT (Final result) Result time: 06/11/16 12:32:02   Final result by Rad Results In Interface (06/11/16 12:32:02)   Narrative:   CLINICAL DATA: Pain radiating into left lower extremity  EXAM: DG HIP (WITH OR WITHOUT PELVIS) 2-3V LEFT  COMPARISON: Apr 19, 2016  FINDINGS: Frontal pelvis as well as frontal and lateral left hip images were obtained. There is no fracture or dislocation. There is mild symmetric narrowing of both hip joints. There is cortical thickening of the left superior pubic ramus; this finding appears consistent with Paget's disease. No erosive change.  IMPRESSION: No fracture or dislocation. Mild symmetric narrowing of both hip joints. 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   Electronically Signed By: Lowella Grip III M.D. On: 06/11/2016 12:32       ____________________________________________   PROCEDURES  Procedure(s) performed: None  Critical Care performed: No  ____________________________________________   INITIAL IMPRESSION / ASSESSMENT AND PLAN / ED COURSE  Pertinent labs & imaging results that were available during my care of the patient were reviewed by me and considered in my medical decision making (see chart for details).  Patient presents for pain in the left lower back. It appears to be acute on chronic process, and his symptoms seem to resemble out of sciatica or potential nerve root compression. Seems to be somewhat radicular  in nature. There is no trauma. He seemed an amply stable, afebrile with no complaint of abdominal pain. Given his age and history of hypertension, he also has reduced GFR, we will screen for unlikely vascular etiology such as aneurysm or dissection though he has good distal pulses using ultrasound of the aorta. Overall feel is low risk. In addition, we'll obtain plain films to assess for possible compression fracture or hip injury causing his pain though I suspect is all very chronic in nature. Discussed pain control the patient, he wishes to have Tylenol for pain at this time.  Return precautions and treatment recommendations and follow-up discussed with the patient who is agreeable with the plan.  ____________________________________________   FINAL CLINICAL IMPRESSION(S) / ED DIAGNOSES  Final diagnoses:  Chronic radicular low back pain      Delman Kitten, MD 06/11/16 1614

## 2016-06-11 NOTE — ED Notes (Addendum)
Pt from [facility] independent living with c/o lower back pain with radiation of pain into left leg, hx of same. PT a/o x 4. Has a caregiver at home and walks with a walker. Pt also reports left lower abd pain that radiates into his left flank.

## 2016-06-11 NOTE — ED Notes (Signed)
Patient transported to X-ray 

## 2016-06-11 NOTE — ED Notes (Signed)
EMS transportation transported patient without verifying arrival for transport to RN.  RN unable to obtain updated VS or signature on patient.  Patient's family already called prior to for discharge instructions and results.

## 2016-06-11 NOTE — Discharge Instructions (Signed)
You have been seen in the Emergency Department (ED)  today for back pain.  Your workup and exam have not shown any acute abnormalities.  Please follow up with your doctor as soon as possible regarding today's ED visit and your back pain.  Return to the ED for worsening back pain, fever, weakness or numbness of either leg, or if you develop either (1) an inability to urinate or have bowel movements, or (2) loss of your ability to control your bathroom functions (if you start having "accidents"), or if you develop other new symptoms that concern you.   Radicular Pain Radicular pain in either the arm or leg is usually from a bulging or herniated disk in the spine. A piece of the herniated disk may press against the nerves as the nerves exit the spine. This causes pain which is felt at the tips of the nerves down the arm or leg. Other causes of radicular pain may include:  Fractures.  Heart disease.  Cancer.  An abnormal and usually degenerative state of the nervous system or nerves (neuropathy). Diagnosis may require CT or MRI scanning to determine the primary cause.  Nerves that start at the neck (nerve roots) may cause radicular pain in the outer shoulder and arm. It can spread down to the thumb and fingers. The symptoms vary depending on which nerve root has been affected. In most cases radicular pain improves with conservative treatment. Neck problems may require physical therapy, a neck collar, or cervical traction. Treatment may take many weeks, and surgery may be considered if the symptoms do not improve.  Conservative treatment is also recommended for sciatica. Sciatica causes pain to radiate from the lower back or buttock area down the leg into the foot. Often there is a history of back problems. Most patients with sciatica are better after 2 to 4 weeks of rest and other supportive care. Short term bed rest can reduce the disk pressure considerably. Sitting, however, is not a good position  since this increases the pressure on the disk. You should avoid bending, lifting, and all other activities which make the problem worse. Traction can be used in severe cases. Surgery is usually reserved for patients who do not improve within the first months of treatment. Only take over-the-counter or prescription medicines for pain, discomfort, or fever as directed by your caregiver. Narcotics and muscle relaxants may help by relieving more severe pain and spasm and by providing mild sedation. Cold or massage can give significant relief. Spinal manipulation is not recommended. It can increase the degree of disc protrusion. Epidural steroid injections are often effective treatment for radicular pain. These injections deliver medicine to the spinal nerve in the space between the protective covering of the spinal cord and back bones (vertebrae). Your caregiver can give you more information about steroid injections. These injections are most effective when given within two weeks of the onset of pain.  You should see your caregiver for follow up care as recommended. A program for neck and back injury rehabilitation with stretching and strengthening exercises is an important part of management.  SEEK IMMEDIATE MEDICAL CARE IF:  You develop increased pain, weakness, or numbness in your arm or leg.  You develop difficulty with bladder or bowel control.  You develop abdominal pain.   This information is not intended to replace advice given to you by your health care provider. Make sure you discuss any questions you have with your health care provider.   Document Released: 12/16/2004 Document  Revised: 11/29/2014 Document Reviewed: 06/04/2015 Elsevier Interactive Patient Education [employer].

## 2016-06-20 ENCOUNTER — Encounter: Payer: Self-pay | Admitting: *Deleted

## 2016-06-20 ENCOUNTER — Emergency Department
Admission: EM | Admit: 2016-06-20 | Discharge: 2016-06-20 | Disposition: A | Discharge status: Home / Self Care | Payer: Medicare Other | Source: Home / Self Care | Attending: Emergency Medicine | Admitting: Emergency Medicine

## 2016-06-20 ENCOUNTER — Emergency Department: Payer: Medicare Other

## 2016-06-20 DIAGNOSIS — M545 Low back pain, unspecified: Secondary | ICD-10-CM

## 2016-06-20 DIAGNOSIS — Y939 Activity, unspecified: Secondary | ICD-10-CM | POA: Insufficient documentation

## 2016-06-20 DIAGNOSIS — I10 Essential (primary) hypertension: Secondary | ICD-10-CM | POA: Insufficient documentation

## 2016-06-20 DIAGNOSIS — Z79899 Other long term (current) drug therapy: Secondary | ICD-10-CM | POA: Insufficient documentation

## 2016-06-20 DIAGNOSIS — I2510 Atherosclerotic heart disease of native coronary artery without angina pectoris: Secondary | ICD-10-CM

## 2016-06-20 DIAGNOSIS — Z87891 Personal history of nicotine dependence: Secondary | ICD-10-CM | POA: Insufficient documentation

## 2016-06-20 DIAGNOSIS — Y92121 Bathroom in nursing home as the place of occurrence of the external cause: Secondary | ICD-10-CM

## 2016-06-20 DIAGNOSIS — Y999 Unspecified external cause status: Secondary | ICD-10-CM

## 2016-06-20 DIAGNOSIS — W010XXA Fall on same level from slipping, tripping and stumbling without subsequent striking against object, initial encounter: Secondary | ICD-10-CM

## 2016-06-20 DIAGNOSIS — W19XXXA Unspecified fall, initial encounter: Secondary | ICD-10-CM

## 2016-06-20 DIAGNOSIS — Z7982 Long term (current) use of aspirin: Secondary | ICD-10-CM | POA: Insufficient documentation

## 2016-06-20 MED ORDER — OXYCODONE HCL 5 MG PO TABS
5.0000 mg | ORAL_TABLET | Freq: Once | ORAL | Status: AC
  Administered 2016-06-20: 5 mg via ORAL
  Filled 2016-06-20: qty 1

## 2016-06-20 MED ORDER — LIDOCAINE 5 % EX PTCH: 1.0000 | MEDICATED_PATCH | Freq: Two times a day (BID) | CUTANEOUS | 0 refills | Status: AC

## 2016-06-20 MED ORDER — ACETAMINOPHEN 500 MG PO TABS
1000.0000 mg | ORAL_TABLET | Freq: Once | ORAL | Status: AC
  Administered 2016-06-20: 1000 mg via ORAL
  Filled 2016-06-20: qty 2

## 2016-06-20 MED ORDER — LIDOCAINE 5 % EX PTCH
1.0000 | MEDICATED_PATCH | CUTANEOUS | Status: DC
  Administered 2016-06-20: 1 via TRANSDERMAL
  Filled 2016-06-20: qty 1

## 2016-06-20 MED ORDER — OXYCODONE HCL 5 MG PO TABS: 5.0000 mg | ORAL_TABLET | Freq: Four times a day (QID) | ORAL | 0 refills | Status: DC | PRN

## 2016-06-20 MED ORDER — FENTANYL CITRATE (PF) 100 MCG/2ML IJ SOLN
50.0000 ug | Freq: Once | INTRAMUSCULAR | Status: AC
  Administered 2016-06-20: 50 ug via INTRAMUSCULAR
  Filled 2016-06-20: qty 2

## 2016-06-20 MED ORDER — HYDRALAZINE HCL 50 MG PO TABS
50.0000 mg | ORAL_TABLET | Freq: Once | ORAL | Status: AC
  Administered 2016-06-20: 50 mg via ORAL
  Filled 2016-06-20: qty 1

## 2016-06-20 NOTE — ED Triage Notes (Signed)
Pt here by EMS from [facility], pt was using the bathroom, went to sit on toilet, missed seat and sat/fell to floor, pt alert and oriented , pt denies hitting head or LOC, pt complains of back pain

## 2016-06-20 NOTE — ED Provider Notes (Signed)
[facility] Emergency Department Provider Note  ____________________________________________  Time seen: Approximately 6:51 PM  I have reviewed the triage vital signs and the nursing notes.   HISTORY  Chief Complaint Fall and Back Pain   HPI Victor Dillon is a 82 y.o. male history of CAD, hypercholesterolemia, hypertension who presents for evaluation after mechanical fall. Patient lives in a skilled nursing facility and was going to the bathroom. He was trying to transition from the wheelchair to the toilet when he slipped and fell onto his buttock. Patient has a history of chronic low back pain and is now coming in with an acute exacerbation. He reports that the pain is moderate to severe, sharp, worse with movement, located in the left lower lumbar region, nonradiating, and constant since the fall. Patient was able to crawl up to the bed and called the nursing staff who then called EMS. Patient is not on any blood thinners. He denies head trauma or LOC. Denies neck pain, chest pain, abdominal pain, palpitations, nausea, vomiting, pain in any of his extremities.  Past Medical History:  Diagnosis Date  . Coronary artery disease   . Hypercholesteremia   . Hypertension   . Murmur, heart     Patient Active Problem List   Diagnosis Date Noted  . Lower GI bleed 12/20/2015  . Rectal bleed 12/15/2015  . Chest pain 07/17/2015    Past Surgical History:  Procedure Laterality Date  . APPENDECTOMY      Prior to Admission medications   Medication Sig Start Date End Date Taking? Authorizing Provider  acetaminophen (TYLENOL) 325 MG tablet Take 650 mg by mouth every 4 (four) hours as needed.    Historical Provider, MD  aspirin EC 81 MG tablet Take 81 mg by mouth daily.    Historical Provider, MD  calcium carbonate (CALCIUM 600) 600 MG TABS tablet Take 600 mg by mouth 2 (two) times daily.    Historical Provider, MD  cephALEXin (KEFLEX) 500 MG capsule Take 1  capsule (500 mg total) by mouth 2 (two) times daily. 04/19/16   Joanne Gavel, MD  Cholecalciferol 10000 units TABS Take 1 tablet by mouth daily.    Historical Provider, MD  docusate sodium (COLACE) 100 MG capsule Take 2 capsules (200 mg total) by mouth 2 (two) times daily. 05/17/15   Carrie Mew, MD  finasteride (PROPECIA) 1 MG tablet Take 1 mg by mouth daily.     Historical Provider, MD  hydrALAZINE (APRESOLINE) 50 MG tablet Take 50 mg by mouth 3 (three) times daily.    Historical Provider, MD  lactulose (CHRONULAC) 10 GM/15ML solution Take 45 mLs (30 g total) by mouth 2 (two) times daily as needed for mild constipation. 12/17/15   Sital Mody, MD  lidocaine (LIDODERM) 5 % Place 1 patch onto the skin every 12 (twelve) hours. Remove & Discard patch within 12 hours or as directed by MD 06/20/16 06/20/17  Rudene Re, MD  metoprolol succinate (TOPROL XL) 25 MG 24 hr tablet Take 1 tablet (25 mg total) by mouth daily. 07/18/15   Hillary Bow, MD  oxyCODONE (ROXICODONE) 5 MG immediate release tablet Take 1 tablet (5 mg total) by mouth every 6 (six) hours as needed for moderate pain or severe pain. 06/20/16 06/20/17  Rudene Re, MD  polyethylene glycol (MIRALAX / GLYCOLAX) packet Take 17 g by mouth daily. 12/22/15   Dustin Flock, MD  polyethylene glycol (MIRALAX / GLYCOLAX) packet Take 17 g by mouth daily. 05/25/16  Earleen Newport, MD  senna (SENOKOT) 8.6 MG TABS tablet Take 2 tablets (17.2 mg total) by mouth 2 (two) times daily. 05/17/15   Carrie Mew, MD  terazosin (HYTRIN) 5 MG capsule Take 5 mg by mouth at bedtime.    Historical Provider, MD    Allergies Review of patient's allergies indicates no known allergies.  No family history on file.  Social History Social History  Substance Use Topics  . Smoking status: Former Research scientist (life sciences)  . Smokeless tobacco: Never Used  . Alcohol use No    Review of Systems  Constitutional: Negative for fever. Eyes: Negative for visual  changes. ENT: Negative for sore throat. Cardiovascular: Negative for chest pain. Respiratory: Negative for shortness of breath. Gastrointestinal: Negative for abdominal pain, vomiting or diarrhea. Genitourinary: Negative for dysuria. Musculoskeletal: + left lumbar pain. Skin: Negative for rash. Neurological: Negative for headaches, weakness or numbness.  ____________________________________________   PHYSICAL EXAM:  VITAL SIGNS: ED Triage Vitals [06/20/16 1833]  Enc Vitals Group     BP (!) 204/88     Pulse Rate 68     Resp 20     Temp 98.6 F (37 C)     Temp Source Oral     SpO2      Weight 140 lb (63.5 kg)     Height 5\' 7"  (1.702 m)     Head Circumference      Peak Flow      Pain Score      Pain Loc      Pain Edu?      Excl. in [place]?     Constitutional: Alert and oriented. Well appearing and in no apparent distress. HEENT:      Head: Normocephalic and atraumatic.         Eyes: Conjunctivae are normal. Sclera is non-icteric. EOMI. PERRL      Mouth/Throat: Mucous membranes are moist.       Neck: Supple with no signs of meningismus. Cardiovascular: Regular rate and rhythm. No murmurs, gallops, or rubs. 2+ symmetrical distal pulses are present in all extremities. No JVD. Respiratory: Normal respiratory effort. Lungs are clear to auscultation bilaterally. No wheezes, crackles, or rhonchi.  Gastrointestinal: Soft, non tender, and non distended with positive bowel sounds. No rebound or guarding. Genitourinary: No CVA tenderness. Musculoskeletal: Patient is tender to palpation in the paraspinal region of the left lumbar area, no midline CT and L-spine tenderness, no tenderness to palpation with internal and external rotation of bilateral hips however patient is tender on the lateral aspect of his left femur.  Neurologic: Normal speech and language. Face is symmetric. Moving all extremities. No gross focal neurologic deficits are appreciated. Skin: Skin is warm, dry and intact.  No rash noted. Psychiatric: Mood and affect are normal. Speech and behavior are normal.  ____________________________________________   LABS (all labs ordered are listed, but only abnormal results are displayed)  Labs Reviewed - No data to display ____________________________________________  EKG  none  ____________________________________________  RADIOLOGY  XR pelvis: negative  XR lumbar spine: negative ____________________________________________   PROCEDURES  Procedure(s) performed: None Procedures Critical Care performed:  None ____________________________________________   INITIAL IMPRESSION / ASSESSMENT AND PLAN / ED COURSE  82 y.o. male history of CAD, hypercholesterolemia, hypertension who presents for evaluation acute on chronic left lower lumbar pain after mechanical fall this evening. No head trauma or LOC. No midline c/t/lspine ttp. Patient is tender on the left lower paraspinal region and left lateral proximal femur. No other injuries on  exam. We'll give him IM fentanyl, by mouth oxycodone, by mouth Tylenol for pain. We'll get x-rays of his lumbar spine and pelvis with bilateral hips.  Clinical Course   _________________________ 8:17 PM on 06/20/2016 -----------------------------------------  XRs with no Acute injuries. Patient had a meal in the emergency department with no problems. Pain is well controlled. We'll discharge home on Tylenol 1000 mg every 8 hours standing, oxycodone 5 mg when necessary, and bowel regimen while in oxycodone. Patient was also given prescription for Lidoderm patches. Patient was instructed to follow-up with his doctor in 2 days.  Pertinent labs & imaging results that were available during my care of the patient were reviewed by me and considered in my medical decision making (see chart for details).    ____________________________________________   FINAL CLINICAL IMPRESSION(S) / ED DIAGNOSES  Final diagnoses:  Fall,  initial encounter  Left-sided low back pain without sciatica      NEW MEDICATIONS STARTED DURING THIS VISIT:  New Prescriptions   LIDOCAINE (LIDODERM) 5 %    Place 1 patch onto the skin every 12 (twelve) hours. Remove & Discard patch within 12 hours or as directed by MD   OXYCODONE (ROXICODONE) 5 MG IMMEDIATE RELEASE TABLET    Take 1 tablet (5 mg total) by mouth every 6 (six) hours as needed for moderate pain or severe pain.     Note:  This document was prepared using Dragon voice recognition software and may include unintentional dictation errors.    Rudene Re, MD 06/20/16 2017

## 2016-06-20 NOTE — ED Notes (Signed)
Pt able to ambulate in room with rolling walker without pain to back.

## 2016-06-20 NOTE — ED Notes (Signed)
Patient's discharge and follow up information reviewed with patient by ED nursing staff and patient given the opportunity to ask questions pertaining to ED visit and discharge plan of care. Patient advised that should symptoms not continue to improve, resolve entirely, or should new symptoms develop then a follow up visit with their PCP or a return visit to the ED may be warranted. Patient verbalized consent and understanding of discharge plan of care including potential need for further evaluation. Patient discharged in stable condition per attending ED physician on duty.   EMS present to transport pt back to [facility].

## 2016-06-20 NOTE — Discharge Instructions (Signed)
Pain control: lidoderm patch one every 24 hours. Tylenol 1000mg  every 8 hours. Oxycodone 5mg  as needed for breakthrough pain every 4-6 hours. Take colace once a day and senna once a day while on oxycodone to prevent constipation. Follow up with your doctor in 2 days.

## 2016-06-22 ENCOUNTER — Encounter: Payer: Self-pay | Admitting: Emergency Medicine

## 2016-06-22 ENCOUNTER — Emergency Department: Payer: Medicare Other

## 2016-06-22 ENCOUNTER — Inpatient Hospital Stay
Admission: EM | Admit: 2016-06-22 | Discharge: 2016-06-25 | DRG: 552 | Disposition: A | Discharge status: Skilled Nursing Facility | Payer: Medicare Other | Attending: Internal Medicine | Admitting: Internal Medicine

## 2016-06-22 DIAGNOSIS — M545 Low back pain, unspecified: Secondary | ICD-10-CM | POA: Diagnosis present

## 2016-06-22 DIAGNOSIS — M25569 Pain in unspecified knee: Secondary | ICD-10-CM | POA: Diagnosis present

## 2016-06-22 DIAGNOSIS — I2510 Atherosclerotic heart disease of native coronary artery without angina pectoris: Secondary | ICD-10-CM | POA: Diagnosis present

## 2016-06-22 DIAGNOSIS — E785 Hyperlipidemia, unspecified: Secondary | ICD-10-CM | POA: Diagnosis present

## 2016-06-22 DIAGNOSIS — Z7982 Long term (current) use of aspirin: Secondary | ICD-10-CM | POA: Diagnosis not present

## 2016-06-22 DIAGNOSIS — W19XXXA Unspecified fall, initial encounter: Secondary | ICD-10-CM | POA: Diagnosis present

## 2016-06-22 DIAGNOSIS — Z79899 Other long term (current) drug therapy: Secondary | ICD-10-CM

## 2016-06-22 DIAGNOSIS — Z66 Do not resuscitate: Secondary | ICD-10-CM | POA: Diagnosis not present

## 2016-06-22 DIAGNOSIS — C7951 Secondary malignant neoplasm of bone: Secondary | ICD-10-CM | POA: Diagnosis present

## 2016-06-22 DIAGNOSIS — Z87891 Personal history of nicotine dependence: Secondary | ICD-10-CM | POA: Diagnosis not present

## 2016-06-22 DIAGNOSIS — Z515 Encounter for palliative care: Secondary | ICD-10-CM | POA: Diagnosis not present

## 2016-06-22 DIAGNOSIS — R011 Cardiac murmur, unspecified: Secondary | ICD-10-CM | POA: Diagnosis present

## 2016-06-22 DIAGNOSIS — E7800 Pure hypercholesterolemia, unspecified: Secondary | ICD-10-CM | POA: Diagnosis present

## 2016-06-22 DIAGNOSIS — I10 Essential (primary) hypertension: Secondary | ICD-10-CM | POA: Diagnosis present

## 2016-06-22 LAB — URINALYSIS COMPLETE WITH MICROSCOPIC (ARMC ONLY)
Bilirubin Urine: NEGATIVE
Glucose, UA: NEGATIVE mg/dL
Ketones, ur: NEGATIVE mg/dL
Nitrite: NEGATIVE
PH: 7 (ref 5.0–8.0)
PROTEIN: 30 mg/dL — AB
Specific Gravity, Urine: 1.012 (ref 1.005–1.030)

## 2016-06-22 LAB — CBC WITH DIFFERENTIAL/PLATELET
BASOS PCT: 0 %
Band Neutrophils: 0 %
Basophils Absolute: 0 10*3/uL (ref 0–0.1)
Blasts: 0 %
EOS ABS: 0.1 10*3/uL (ref 0–0.7)
Eosinophils Relative: 2 %
HCT: 36.3 % — ABNORMAL LOW (ref 40.0–52.0)
HEMOGLOBIN: 12.3 g/dL — AB (ref 13.0–18.0)
LYMPHS PCT: 25 %
Lymphs Abs: 1.8 10*3/uL (ref 1.0–3.6)
MCH: 29.4 pg (ref 26.0–34.0)
MCHC: 34 g/dL (ref 32.0–36.0)
MCV: 86.4 fL (ref 80.0–100.0)
MONO ABS: 0.4 10*3/uL (ref 0.2–1.0)
MYELOCYTES: 0 %
Metamyelocytes Relative: 0 %
Monocytes Relative: 5 %
Neutro Abs: 5 10*3/uL (ref 1.4–6.5)
Neutrophils Relative %: 68 %
OTHER: 0 %
PROMYELOCYTES ABS: 0 %
Platelets: 172 10*3/uL (ref 150–440)
RBC: 4.2 MIL/uL — ABNORMAL LOW (ref 4.40–5.90)
RDW: 20.4 % — ABNORMAL HIGH (ref 11.5–14.5)
Smear Review: ADEQUATE
WBC: 7.3 10*3/uL (ref 3.8–10.6)
nRBC: 0 /100 WBC

## 2016-06-22 LAB — COMPREHENSIVE METABOLIC PANEL
ALBUMIN: 4.2 g/dL (ref 3.5–5.0)
ALK PHOS: 66 U/L (ref 38–126)
ALT: 9 U/L — ABNORMAL LOW (ref 17–63)
ANION GAP: 8 (ref 5–15)
AST: 23 U/L (ref 15–41)
BUN: 19 mg/dL (ref 6–20)
CALCIUM: 9.9 mg/dL (ref 8.9–10.3)
CO2: 24 mmol/L (ref 22–32)
Chloride: 107 mmol/L (ref 101–111)
Creatinine, Ser: 1.14 mg/dL (ref 0.61–1.24)
GFR calc Af Amer: >60 mL/min (ref >60)
GFR calc non Af Amer: 52 mL/min — ABNORMAL LOW (ref >60)
GLUCOSE: 100 mg/dL — AB (ref 65–99)
POTASSIUM: 3.9 mmol/L (ref 3.5–5.1)
SODIUM: 139 mmol/L (ref 135–145)
Total Bilirubin: 0.6 mg/dL (ref 0.3–1.2)
Total Protein: 7.8 g/dL (ref 6.5–8.1)

## 2016-06-22 LAB — MRSA PCR SCREENING: MRSA by PCR: NEGATIVE

## 2016-06-22 MED ORDER — POLYETHYLENE GLYCOL 3350 17 G PO PACK
17.0000 g | PACK | Freq: Every day | ORAL | Status: DC
  Administered 2016-06-23 – 2016-06-25 (×3): 17 g via ORAL
  Filled 2016-06-22 (×3): qty 1

## 2016-06-22 MED ORDER — ACETAMINOPHEN 325 MG PO TABS
650.0000 mg | ORAL_TABLET | Freq: Four times a day (QID) | ORAL | Status: DC | PRN
  Filled 2016-06-22: qty 2

## 2016-06-22 MED ORDER — LIDOCAINE 5 % EX PTCH
1.0000 | MEDICATED_PATCH | CUTANEOUS | Status: DC
  Administered 2016-06-22 – 2016-06-24 (×3): 1 via TRANSDERMAL
  Filled 2016-06-22 (×4): qty 1

## 2016-06-22 MED ORDER — HYDRALAZINE HCL 50 MG PO TABS
50.0000 mg | ORAL_TABLET | Freq: Three times a day (TID) | ORAL | Status: DC
  Administered 2016-06-22 – 2016-06-25 (×9): 50 mg via ORAL
  Filled 2016-06-22 (×9): qty 1

## 2016-06-22 MED ORDER — METOPROLOL SUCCINATE ER 25 MG PO TB24
25.0000 mg | ORAL_TABLET | Freq: Every day | ORAL | Status: DC
  Administered 2016-06-23 – 2016-06-25 (×3): 25 mg via ORAL
  Filled 2016-06-22 (×3): qty 1

## 2016-06-22 MED ORDER — OXYCODONE HCL 5 MG PO TABS: 5.0000 mg | ORAL_TABLET | Freq: Four times a day (QID) | ORAL | Status: DC | PRN

## 2016-06-22 MED ORDER — HYDROMORPHONE HCL 1 MG/ML IJ SOLN
1.0000 mg | Freq: Once | INTRAMUSCULAR | Status: AC
  Administered 2016-06-22: 1 mg via INTRAVENOUS
  Filled 2016-06-22: qty 1

## 2016-06-22 MED ORDER — ONDANSETRON HCL 4 MG/2ML IJ SOLN
4.0000 mg | Freq: Once | INTRAMUSCULAR | Status: AC
  Administered 2016-06-22: 4 mg via INTRAVENOUS
  Filled 2016-06-22: qty 2

## 2016-06-22 MED ORDER — MORPHINE SULFATE (PF) 4 MG/ML IV SOLN
4.0000 mg | INTRAVENOUS | Status: DC | PRN
  Administered 2016-06-22: 4 mg via INTRAVENOUS
  Filled 2016-06-22: qty 1

## 2016-06-22 MED ORDER — FINASTERIDE 1 MG PO TABS
2.0000 mg | ORAL_TABLET | Freq: Every day | ORAL | Status: DC
  Administered 2016-06-23 – 2016-06-25 (×3): 2 mg via ORAL
  Filled 2016-06-22 (×4): qty 2

## 2016-06-22 MED ORDER — ONDANSETRON HCL 4 MG/2ML IJ SOLN: 4.0000 mg | Freq: Four times a day (QID) | INTRAMUSCULAR | Status: DC | PRN

## 2016-06-22 MED ORDER — ONDANSETRON HCL 4 MG PO TABS: 4.0000 mg | ORAL_TABLET | Freq: Four times a day (QID) | ORAL | Status: DC | PRN

## 2016-06-22 MED ORDER — LIDOCAINE 5 % EX PTCH: 1.0000 | MEDICATED_PATCH | Freq: Two times a day (BID) | CUTANEOUS | Status: DC

## 2016-06-22 MED ORDER — LACTULOSE 10 GM/15ML PO SOLN
30.0000 g | Freq: Two times a day (BID) | ORAL | Status: DC | PRN
  Administered 2016-06-24: 30 g via ORAL
  Filled 2016-06-22: qty 60

## 2016-06-22 MED ORDER — BISACODYL 5 MG PO TBEC: 5.0000 mg | DELAYED_RELEASE_TABLET | Freq: Every day | ORAL | Status: DC | PRN

## 2016-06-22 MED ORDER — DOCUSATE SODIUM 100 MG PO CAPS
200.0000 mg | ORAL_CAPSULE | Freq: Two times a day (BID) | ORAL | Status: DC
  Administered 2016-06-22 – 2016-06-25 (×6): 200 mg via ORAL
  Filled 2016-06-22 (×6): qty 2

## 2016-06-22 MED ORDER — ENOXAPARIN SODIUM 30 MG/0.3ML ~~LOC~~ SOLN
30.0000 mg | SUBCUTANEOUS | Status: DC
Start: 2016-06-22 — End: 2016-06-23

## 2016-06-22 MED ORDER — VITAMIN D 1000 UNITS PO TABS
1000.0000 [IU] | ORAL_TABLET | Freq: Every day | ORAL | Status: DC
  Administered 2016-06-23 – 2016-06-25 (×4): 1000 [IU] via ORAL
  Filled 2016-06-22 (×3): qty 1

## 2016-06-22 MED ORDER — MORPHINE SULFATE (PF) 4 MG/ML IV SOLN
4.0000 mg | Freq: Once | INTRAVENOUS | Status: AC
  Administered 2016-06-22: 4 mg via INTRAVENOUS
  Filled 2016-06-22: qty 1

## 2016-06-22 MED ORDER — CALCIUM CARBONATE ANTACID 500 MG PO CHEW
500.0000 mg | CHEWABLE_TABLET | Freq: Two times a day (BID) | ORAL | Status: DC
  Administered 2016-06-22 – 2016-06-25 (×6): 500 mg via ORAL
  Filled 2016-06-22 (×6): qty 1

## 2016-06-22 MED ORDER — TERAZOSIN HCL 1 MG PO CAPS
5.0000 mg | ORAL_CAPSULE | Freq: Every day | ORAL | Status: DC
  Administered 2016-06-22 – 2016-06-24 (×3): 5 mg via ORAL
  Filled 2016-06-22 (×3): qty 5

## 2016-06-22 MED ORDER — SENNA 8.6 MG PO TABS
2.0000 | ORAL_TABLET | Freq: Two times a day (BID) | ORAL | Status: DC
  Administered 2016-06-22 – 2016-06-25 (×6): 17.2 mg via ORAL
  Filled 2016-06-22 (×7): qty 2

## 2016-06-22 MED ORDER — FLEET ENEMA 7-19 GM/118ML RE ENEM
1.0000 | ENEMA | Freq: Once | RECTAL | Status: DC | PRN
Start: 2016-06-22 — End: 2016-06-25

## 2016-06-22 MED ORDER — ASPIRIN EC 81 MG PO TBEC
81.0000 mg | DELAYED_RELEASE_TABLET | Freq: Every day | ORAL | Status: DC
  Administered 2016-06-23 – 2016-06-25 (×3): 81 mg via ORAL
  Filled 2016-06-22 (×3): qty 1

## 2016-06-22 NOTE — ED Notes (Signed)
Urine was sent to the lab. Pt was given a [place] tray and a sprite per request

## 2016-06-22 NOTE — ED Triage Notes (Signed)
Pt arrived here via EMS from [facility] assisted living, due to central back pain.  Pt fell 3 days ago, was seen here in ED for same back pain, and was sent home with pain management.  Pt back today stating that the pain regimen did not help and that he could not get out of bed today. Pt alert and oriented.

## 2016-06-22 NOTE — ED Provider Notes (Signed)
Time Seen: Approximately 10:14 AM  I have reviewed the triage notes  Chief Complaint: Back Pain   History of Present Illness: Victor Dillon is a 82 y.o. male who presents after a non-syncopal fall that occurred 2 days ago. The patient's had persistent mid and lower back discomfort. He does have a history of chronic back pain. Pain medication was prescribed which really hasn't shown any symptomatic improvement. Patient was seen and evaluated here yesterday and had x-rays of the lumbar spine which did not show any acute abnormalities. No history of head trauma.   Past Medical History:  Diagnosis Date  . Coronary artery disease   . Hypercholesteremia   . Hypertension   . Murmur, heart     Patient Active Problem List   Diagnosis Date Noted  . Lower GI bleed 12/20/2015  . Rectal bleed 12/15/2015  . Chest pain 07/17/2015    Past Surgical History:  Procedure Laterality Date  . APPENDECTOMY      Past Surgical History:  Procedure Laterality Date  . APPENDECTOMY      Current Outpatient Rx  . Order #: [id number] Class: Historical Med  . Order #: [id number] Class: Historical Med  . Order #: [id number] Class: Historical Med  . Order #: [id number] Class: Historical Med  . Order #: [id number] Class: Print  . Order #: [id number] Class: Historical Med  . Order #: [id number] Class: Historical Med  . Order #: [id number] Class: Normal  . Order #: [id number] Class: Print  . Order #: [id number] Class: Print  . Order #: [id number] Class: Print  . Order #: [id number] Class: Print  . Order #: [id number] Class: Historical Med  . Order #: [id number] Class: Normal    Allergies:  Review of patient's allergies indicates no known allergies.  Family History: No family history on file.  Social History: Social History  Substance Use Topics  . Smoking status: Former Research scientist (life sciences)  . Smokeless tobacco: Never Used  . Alcohol use No     Review of Systems:   10 point review of systems was performed and was  otherwise negative:  Constitutional: No fever Eyes: No visual disturbances ENT: No sore throat, ear pain Cardiac: No chest pain Respiratory: No shortness of breath, wheezing, or stridor Abdomen: No abdominal pain, no vomiting, No diarrhea Endocrine: No weight loss, No night sweats Extremities: No peripheral edema, cyanosis Skin: No rashes, easy bruising Neurologic: Patient's had difficulty with sitting upright and any movement without any known focal weakness Urologic: No dysuria, Hematuria, or urinary frequency   Physical Exam:  ED Triage Vitals  Enc Vitals Group     BP 06/22/16 1009 (!) 186/84     Pulse Rate 06/22/16 1009 77     Resp 06/22/16 1009 16     Temp 06/22/16 1009 98.3 F (36.8 C)     Temp Source 06/22/16 1009 Oral     SpO2 06/22/16 1005 98 %     Weight 06/22/16 1009 140 lb (63.5 kg)     Height 06/22/16 1009 5\' 8"  (1.727 m)     Head Circumference --      Peak Flow --      Pain Score 06/22/16 1013 8     Pain Loc --      Pain Edu? --      Excl. in [place]? --     General: Awake , Alert , and Oriented times 3; GCS 15 Head: Normal cephalic , atraumatic Eyes: Pupils equal , round, reactive to light Nose/Throat: No nasal drainage, patent upper airway without erythema or exudate.  Neck:  Supple, Full range of motion, No anterior adenopathy or palpable thyroid masses Lungs: Clear to ascultation without wheezes , rhonchi, or rales Heart: Regular rate, regular rhythm without murmurs , gallops , or rubs Abdomen: Soft, non tender without rebound, guarding , or rigidity; bowel sounds positive and symmetric in all 4 quadrants. No organomegaly .        Extremities: 2 plus symmetric pulses. No edema, clubbing or cyanosis Neurologic: normal ambulation, Motor symmetric without deficits, sensory intact Skin: warm, dry, no rashes Patient has very reproducible pain at the level of L1-L2 with direct palpation of the midline spine without any crepitus or step-off noted. He has full range  of motion at the hip with some mild discomfort. No limb shortening. She does have strength in both lower extremities and able lift his legs up off the stretcher against resistance.  Labs:   All laboratory work was reviewed including any pertinent negatives or positives listed below:  Labs Reviewed  CBC WITH DIFFERENTIAL/PLATELET - Abnormal; Notable for the following:       Result Value   RBC 4.20 (*)    Hemoglobin 12.3 (*)    HCT 36.3 (*)    RDW 20.4 (*)    All other components within normal limits  COMPREHENSIVE METABOLIC PANEL - Abnormal; Notable for the following:    Glucose, Bld 100 (*)    ALT 9 (*)    GFR calc non Af Amer 52 (*)    All other components within normal limits  URINALYSIS COMPLETEWITH MICROSCOPIC ([facility] ONLY)    Radiology:  EXAM: CT LUMBAR SPINE WITHOUT CONTRAST  TECHNIQUE: Multidetector CT imaging of the lumbar spine was performed without intravenous contrast administration. Multiplanar CT image reconstructions were also generated.  COMPARISON:  CT scan of December 20, 2015.  FINDINGS: No fracture or spondylolisthesis is noted. Mild osteophyte formation is noted anteriorly at all levels of the lumbar spine. Mild degenerative disc disease is noted at L5-S1. Atherosclerosis of abdominal aorta is noted. No significant central spinal canal stenosis is noted. Several low densities are noted in the posterior portions of both iliac bones.  IMPRESSION: Aortic atherosclerosis. Mild degenerative changes are noted. No fracture or dislocation is noted.  Several lucencies are seen in the posterior portions of both iliac bones which potentially may represent metastatic disease. Clinical correlation is recommended.   Electronically Signed   By: Marijo Conception, M.D.   On: 06/22/2016 13:00      I personally reviewed the radiologic studies    ED Course: Patient's only had some mild symptomatic improvement with his back pain still unable to sit  upright and concern with his caretakers with the current living conditions that he would not be old to take care of himself. He apparently is in the independent living area of [facility] facility. Patient may require physical therapy. He also has what appears to be some new lytic lesions per his CAT scan and given his age etc. may have prostate cancer. Patient was given a lidocaine patch, IM morphine and then eventually IV Dilaudid with Zofran with some improvement  Clinical Course     Assessment: Persistent lower back pain, status post fall   Final Clinical Impression:   Final diagnoses:  Midline low back pain without sciatica     Plan:  I spoke to the hospitalist team, further disposition and management depends upon her evaluation           Daymon Larsen, MD 06/22/16 1607

## 2016-06-22 NOTE — ED Notes (Signed)
Informed RN bed ready [phone]

## 2016-06-22 NOTE — H&P (Signed)
[facility] [place] NAME: Victor Dillon    MR#:  [id number]  DATE OF BIRTH:  [birth year]  DATE OF ADMISSION:  06/22/2016  PRIMARY CARE PHYSICIAN: No PCP Per Patient   REQUESTING/REFERRING PHYSICIAN: Daymon Larsen, MD  CHIEF COMPLAINT:  Fall and low back pain  HISTORY OF PRESENT ILLNESS:  Victor Dillon  is a 82 y.o. male with a known history of Hypertension, coronary artery disease, hyperlipidemia and cardiac murmur has had a non-syncopal fall 2 days ago. Following following the fall he was reporting lower back pain. Gradually the pain is getting worse and patient is unable to ambulate as reported by the patient's caregiver. Patient was seen and evaluated in the emergency department yesterday and x-ray of the lumbar spine did not show any abnormalities. Patient came back to the ED today as the pain is getting worse CT of the lumbar spine has revealed multiple iliac lucencies concerning for metastasis. Patient is resting comfortably during my examination and denies any nausea vomiting, denies any head trauma  PAST MEDICAL HISTORY:   Past Medical History:  Diagnosis Date  . Coronary artery disease   . Hypercholesteremia   . Hypertension   . Murmur, heart     PAST SURGICAL HISTOIRY:   Past Surgical History:  Procedure Laterality Date  . APPENDECTOMY      SOCIAL HISTORY:   Social History  Substance Use Topics  . Smoking status: Former Research scientist (life sciences)  . Smokeless tobacco: Never Used  . Alcohol use No    FAMILY HISTORY:  No family history on file.  DRUG ALLERGIES:  No Known Allergies  REVIEW OF SYSTEMS:  CONSTITUTIONAL: No fever, fatigue or weakness.  EYES: No blurred or double vision.  EARS, NOSE, AND THROAT: No tinnitus or ear pain.  RESPIRATORY: No cough, shortness of breath, wheezing or hemoptysis.  CARDIOVASCULAR: No chest pain, orthopnea, edema.  GASTROINTESTINAL: No nausea, vomiting, diarrhea or abdominal pain.   GENITOURINARY: No dysuria, hematuria.  ENDOCRINE: No polyuria, nocturia,  HEMATOLOGY: No anemia, easy bruising or bleeding SKIN: No rash or lesion. MUSCULOSKELETAL: Reporting lower back pain after a  fall No joint pain or arthritis.   NEUROLOGIC: No tingling, numbness, weakness.  PSYCHIATRY: No anxiety or depression.   MEDICATIONS AT HOME:   Prior to Admission medications   Medication Sig Start Date End Date Taking? Authorizing Provider  acetaminophen (TYLENOL) 325 MG tablet Take 650 mg by mouth every 4 (four) hours as needed.   Yes Historical Provider, MD  aspirin EC 81 MG tablet Take 81 mg by mouth daily.   Yes Historical Provider, MD  calcium carbonate (CALCIUM 600) 600 MG TABS tablet Take 600 mg by mouth 2 (two) times daily.   Yes Historical Provider, MD  Cholecalciferol 10000 units TABS Take 1 tablet by mouth daily.   Yes Historical Provider, MD  docusate sodium (COLACE) 100 MG capsule Take 2 capsules (200 mg total) by mouth 2 (two) times daily. 05/17/15  Yes Carrie Mew, MD  finasteride (PROPECIA) 1 MG tablet Take 1 mg by mouth daily.    Yes Historical Provider, MD  hydrALAZINE (APRESOLINE) 50 MG tablet Take 50 mg by mouth 3 (three) times daily.   Yes Historical Provider, MD  lactulose (CHRONULAC) 10 GM/15ML solution Take 45 mLs (30 g total) by mouth 2 (two) times daily as needed for mild constipation. 12/17/15  Yes Sital Mody, MD  lidocaine (LIDODERM) 5 % Place 1 patch onto the skin every 12 (twelve)  hours. Remove & Discard patch within 12 hours or as directed by MD 06/20/16 06/20/17 Yes Rudene Re, MD  metoprolol succinate (TOPROL XL) 25 MG 24 hr tablet Take 1 tablet (25 mg total) by mouth daily. 07/18/15  Yes Srikar Sudini, MD  oxyCODONE (ROXICODONE) 5 MG immediate release tablet Take 1 tablet (5 mg total) by mouth every 6 (six) hours as needed for moderate pain or severe pain. 06/20/16 06/20/17 Yes Rudene Re, MD  senna (SENOKOT) 8.6 MG TABS tablet Take 2 tablets (17.2  mg total) by mouth 2 (two) times daily. 05/17/15  Yes Carrie Mew, MD  terazosin (HYTRIN) 5 MG capsule Take 5 mg by mouth at bedtime.   Yes Historical Provider, MD  polyethylene glycol (MIRALAX / GLYCOLAX) packet Take 17 g by mouth daily. 12/22/15   Dustin Flock, MD      VITAL SIGNS:  Blood pressure (!) 173/85, pulse 69, temperature 98.3 F (36.8 C), temperature source Oral, resp. rate 16, height 5\' 8"  (1.727 m), weight 63.5 kg (140 lb), SpO2 100 %.  PHYSICAL EXAMINATION:  GENERAL:  82 y.o.-year-old patient lying in the bed with no acute distress.  EYES: Pupils equal, round, reactive to light and accommodation. No scleral icterus. Extraocular muscles intact.  HEENT: Head atraumatic, normocephalic. Oropharynx and nasopharynx clear.  NECK:  Supple, no jugular venous distention. No thyroid enlargement, no tenderness.  LUNGS: Normal breath sounds bilaterally, no wheezing, rales,rhonchi or crepitation. No use of accessory muscles of respiration.  CARDIOVASCULAR: S1, S2 normal. No murmurs, rubs, or gallops.  ABDOMEN: Soft, nontender, nondistended. Bowel sounds present. No organomegaly or mass.  EXTREMITIES: No pedal edema, cyanosis, or clubbing. Lower back is diffusely tender. No paravertebral tenderness. NEUROLOGIC: Cranial nerves II through XII are grossly intact. Muscle strength at his baseline in upper extremities and diminished in the lower extremities from lower back pain Sensation intact. Gait not checked.  PSYCHIATRIC: The patient is alert and oriented x 3.  SKIN: No obvious rash, lesion, or ulcer.   LABORATORY PANEL:   CBC  Recent Labs Lab 06/22/16 1339  WBC 7.3  HGB 12.3*  HCT 36.3*  PLT 172   ------------------------------------------------------------------------------------------------------------------  Chemistries   Recent Labs Lab 06/22/16 1339  NA 139  K 3.9  CL 107  CO2 24  GLUCOSE 100*  BUN 19  CREATININE 1.14  CALCIUM 9.9  AST 23  ALT 9*  ALKPHOS  66  BILITOT 0.6   ------------------------------------------------------------------------------------------------------------------  Cardiac Enzymes No results for input(s): TROPONINI in the last 168 hours. ------------------------------------------------------------------------------------------------------------------  RADIOLOGY:  Dg Lumbar Spine Complete  Result Date: 06/20/2016 CLINICAL DATA:  Fall with lower back pain.  Initial encounter. EXAM: LUMBAR SPINE - COMPLETE 4+ VIEW COMPARISON:  12/15/2015 abdominal CT FINDINGS: Standard segmentation. Negative for acute fracture or subluxation. Diffuse mild to moderate disc narrowing with intermittently bulky spondylotic spurring. Abdominal aortic atherosclerosis. Osteopenia. IMPRESSION: No acute finding. Electronically Signed   By: Monte Fantasia M.D.   On: 06/20/2016 19:40  Dg Pelvis 1-2 Views  Result Date: 06/20/2016 CLINICAL DATA:  Fall with left-sided pain.  Initial encounter. EXAM: PELVIS - 1-2 VIEW COMPARISON:  Acute abdominal series 12/15/2015 FINDINGS: There is no evidence of pelvic fracture or diastasis. Osteopenia. No notable degenerative changes. IMPRESSION: Negative. Electronically Signed   By: Monte Fantasia M.D.   On: 06/20/2016 19:37  Ct Lumbar Spine Wo Contrast  Result Date: 06/22/2016 CLINICAL DATA:  Low back pain after fall 3 days ago at assisted living facility. EXAM: CT LUMBAR SPINE WITHOUT CONTRAST  TECHNIQUE: Multidetector CT imaging of the lumbar spine was performed without intravenous contrast administration. Multiplanar CT image reconstructions were also generated. COMPARISON:  CT scan of December 20, 2015. FINDINGS: No fracture or spondylolisthesis is noted. Mild osteophyte formation is noted anteriorly at all levels of the lumbar spine. Mild degenerative disc disease is noted at L5-S1. Atherosclerosis of abdominal aorta is noted. No significant central spinal canal stenosis is noted. Several low densities are noted in  the posterior portions of both iliac bones. IMPRESSION: Aortic atherosclerosis. Mild degenerative changes are noted. No fracture or dislocation is noted. Several lucencies are seen in the posterior portions of both iliac bones which potentially may represent metastatic disease. Clinical correlation is recommended. Electronically Signed   By: Marijo Conception, M.D.   On: 06/22/2016 13:00    EKG:   Orders placed or performed during the hospital encounter of 06/11/16  . EKG 12-Lead  . EKG 12-Lead  . ED EKG  . ED EKG    IMPRESSION AND PLAN:   Karandeep Haskill  is a 82 y.o. male with a known history of Hypertension, coronary artery disease, hyperlipidemia and cardiac murmur has had a non-syncopal fall 2 days ago. Following following the fall he was reporting lower back pain. Gradually the pain is getting worse and patient is unable to ambulate as reported by the patient's caregiver.  # Acute lower back pain status post fall Admit to MedSurg unit No acute fractures. Provide pain management as needed Physical therapy consult  #Multiple iliac bone lucencies-most probably metastatic disease Patient and his friend who is also healthcare power of attorney are not considering any further investigations considering his age and other comorbidities Continue pain management as needed basis and physical therapy  #Essential hypertension Continue his home medication Toprol, Hytrin, hydralazine and titrate as needed  #Coronary artery disease Currently patient denies any chest pain. Continue his home medication including Toprol  #Hyperlipidemia check fasting lipid panel in a.m.  All the records are reviewed and case discussed with ED provider. Management plans discussed with the patient, family and they are in agreement.  CODE STATUS: dnr/[place] HCPOA  TOTAL TIME TAKING CARE OF THIS PATIENT: 43  minutes.   Note: This dictation was prepared with Dragon dictation along with smaller phrase  technology. Any transcriptional errors that result from this process are unintentional.  Nicholes Mango M.D on 06/22/2016 at 4:56 PM  Between 7am to 6pm - Pager - [phone]  After 6pm go to [email or web address] - password [email or web address] [facility]  [facility]  Office  [phone]  CC: Primary care physician; No PCP Per Patient

## 2016-06-23 LAB — BASIC METABOLIC PANEL
Anion gap: 6 (ref 5–15)
BUN: 23 mg/dL — AB (ref 6–20)
CALCIUM: 9.4 mg/dL (ref 8.9–10.3)
CO2: 28 mmol/L (ref 22–32)
CREATININE: 1.45 mg/dL — AB (ref 0.61–1.24)
Chloride: 107 mmol/L (ref 101–111)
GFR calc Af Amer: 45 mL/min — ABNORMAL LOW (ref >60)
GFR, EST NON AFRICAN AMERICAN: 39 mL/min — AB (ref >60)
GLUCOSE: 123 mg/dL — AB (ref 65–99)
Potassium: 4 mmol/L (ref 3.5–5.1)
Sodium: 141 mmol/L (ref 135–145)

## 2016-06-23 LAB — LIPID PANEL
CHOL/HDL RATIO: 3.2 ratio
CHOLESTEROL: 194 mg/dL (ref 0–200)
HDL: 60 mg/dL (ref >40)
LDL Cholesterol: 124 mg/dL — ABNORMAL HIGH (ref 0–99)
TRIGLYCERIDES: 51 mg/dL (ref <150)
VLDL: 10 mg/dL (ref 0–40)

## 2016-06-23 LAB — CBC
HCT: 32 % — ABNORMAL LOW (ref 40.0–52.0)
Hemoglobin: 10.7 g/dL — ABNORMAL LOW (ref 13.0–18.0)
MCH: 29.3 pg (ref 26.0–34.0)
MCHC: 33.5 g/dL (ref 32.0–36.0)
MCV: 87.4 fL (ref 80.0–100.0)
PLATELETS: 202 10*3/uL (ref 150–440)
RBC: 3.66 MIL/uL — ABNORMAL LOW (ref 4.40–5.90)
RDW: 20.7 % — AB (ref 11.5–14.5)
WBC: 4 10*3/uL (ref 3.8–10.6)

## 2016-06-23 MED ORDER — ENOXAPARIN SODIUM 40 MG/0.4ML ~~LOC~~ SOLN: 40.0000 mg | SUBCUTANEOUS | Status: DC

## 2016-06-23 MED ORDER — ENOXAPARIN SODIUM 30 MG/0.3ML ~~LOC~~ SOLN
30.0000 mg | SUBCUTANEOUS | Status: DC
  Administered 2016-06-23 – 2016-06-24 (×2): 30 mg via SUBCUTANEOUS
  Filled 2016-06-23 (×2): qty 0.3

## 2016-06-23 MED ORDER — SODIUM CHLORIDE 0.9 % IV SOLN
INTRAVENOUS | Status: AC
  Administered 2016-06-23: 09:00:00 via INTRAVENOUS

## 2016-06-23 NOTE — Clinical Social Work Note (Addendum)
Clinical Social Work Assessment  Patient Details  Name: Victor Dillon MRN: [id number] Date of Birth: [birth year]  Date of referral:  06/23/16               Reason for consult:  Facility Placement                Permission sought to share information with:  Chartered certified accountant granted to share information::  Yes, Verbal Permission Granted  Name::      [place]::   [place]   Relationship::     Contact Information:     Housing/Transportation Living arrangements for the past 2 months:  [place] of Information:  Patient, Guardian, Engineer, materials Patient Interpreter Needed:  None Criminal Activity/Legal Involvement Pertinent to Current Situation/Hospitalization:  No - Comment as needed Significant Relationships:  Friend Lives with:  Self, Facility Resident Do you feel safe going back to the place where you live?  Yes Need for family participation in patient care:  Yes (Comment)  Care giving concerns:  Patient lives at [place] alone.    Social Worker assessment / plan:  Holiday representative (CSW) received verbal consult from RN Case Manager that patient will likely require SNF. RN Case Manager spoke with Glendell Docker via telephone who identified himself as patient's guardian. Per Glendell Docker patient receives [facility] at [facility] and will go to [facility] under [employer]. CSW contacted [facility] and spoke with Margarita Grizzle. Per Margarita Grizzle patient is non-service connected and is not eligible for VA contracted SNF. Per Margarita Grizzle if patient requires SNF he will have to go under his Medicare. CSW also spoke with Blanch Media patient's second contact on his facesheet. Per Blanch Media she is a caregiver. CSW explained to [place] the above information. Per Blanch Media she would like for patient to go to [facility] under his Medicare. CSW explained to Blanch Media that patient will require a 3 night qualifying  inpatient stay at a hospital in order for Medicare to pay for [facility]. Blanch Media verbalized her understanding.   FL2 complete and faxed out. [facility] did make a bed offer. CSW met with patient to discuss D/C plan. Patient was alert and oriented and was sitting up in the bed. Patient reported that he has lived at [facility] for several years now and Glendell Docker is his guardian. Per patient Blanch Media is a hired caregiver. Patient is agreeable to SNF search and chose [facility]. Patient understands that he will go to [facility] under his Medicare. Deborah admissions coordinator at [facility] is aware of accepted bed offer.   CSW left Glendell Docker and Blanch Media a Advertising account executive. CSW will continue to follow and assist as needed.   Blanch Media called CSW back and is agreeable to the D/C plan. Glendell Docker called CSW back and reported that he will bring guardianship paper work to [employer]. Glendell Docker is in agreement with D/C plan. CSW also discussed long term care options including Medicaid and private pay.   Employment status:  Retired Forensic scientist:  Medicare PT Recommendations:  [place] / Referral to community resources:  [place]  Patient/Family's Response to care:  Patient is agreeable to going to [employer]   Patient/Family's Understanding of and Emotional Response to Diagnosis, Current Treatment, and Prognosis:  Patient was very pleasant and understands that Medicare will pay days 1-20 at %100 at [facility]. Patient reported that he would  prefer to return to [facility] after [facility] if he is able to.   Emotional Assessment Appearance:  Appears stated age Attitude/Demeanor/Rapport:    Affect (typically observed):  Accepting, Adaptable, Pleasant Orientation:  Oriented to Self, Oriented to Place, Oriented to  Time, Oriented to Situation Alcohol / Substance use:  Not Applicable Psych involvement (Current and /or in the community):  No (Comment)  Discharge Needs  Concerns to be  addressed:  Discharge Planning Concerns Readmission within the last 30 days:  No Current discharge risk:  Dependent with Mobility Barriers to Discharge:  Continued Medical Work up   [employer], Veronia Beets, LCSW 06/23/2016, 2:35 PM

## 2016-06-23 NOTE — Care Management Note (Signed)
Case Management Note  Patient Details  Name: Victor Dillon MRN: [id number] Date of Birth: 06/08/1917  Subjective/Objective:      Spoke with patient who is alert and oriented X3. Patient is from [facility] independent living. He stated that he has been ambulating fine with his rollator walker. He has a caregiver Blanch Media) [phone] who comes to his apartment for 2 hours daily.  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  He gets his medications filled by [place].  Patient stated the his legal guardian would be the best person to discuss his situation with Darleene Cleaver [phone]. Patient offered transfer to [facility] but he did not want to discuss this at this time.  Attempted to call Mr Janace Aris and left voicemail.               Action/Plan: Return to [facility] at this point. Awaiting PT evaluation.   Expected Discharge Date:                  Expected Discharge Plan:  [place] (Needs further evaluation)  In-House Referral:     Discharge planning Services  CM Consult  Post Acute Care Choice:    Choice offered to:     DME Arranged:    DME Agency:     HH Arranged:    HH Agency:     Status of Service:  In process, will continue to follow  If discussed at Long Length of Stay Meetings, dates discussed:    Additional Comments:  Alvie Heidelberg, RN 06/23/2016, 9:42 AM

## 2016-06-23 NOTE — Progress Notes (Signed)
Clinical Education officer, museum ([place]) contacted Victor Dillon at [facility]. Per Victor Dillon patient is non-service connected and is not eligible for SNF under the [place]. Per Victor Dillon if patient requires SNF he will have to use his Medicare. PT is pending. CSW will continue to follow and assist as needed.   [employer], LCSW [phone]

## 2016-06-23 NOTE — Progress Notes (Signed)
[facility] at [place] NAME: Aemon Faller    MR#:  [id number]  DATE OF BIRTH:  [birth year]  SUBJECTIVE:  CHIEF COMPLAINT:   Chief Complaint  Patient presents with  . Back Pain   Continues to have right hip, back and knee pain.  REVIEW OF SYSTEMS:    Review of Systems  Constitutional: Positive for malaise/fatigue. Negative for chills and fever.  HENT: Negative for sore throat.   Eyes: Negative for blurred vision, double vision and pain.  Respiratory: Negative for cough, hemoptysis, shortness of breath and wheezing.   Cardiovascular: Negative for chest pain, palpitations, orthopnea and leg swelling.  Gastrointestinal: Negative for abdominal pain, constipation, diarrhea, heartburn, nausea and vomiting.  Genitourinary: Negative for dysuria and hematuria.  Musculoskeletal: Positive for back pain and joint pain.  Skin: Negative for rash.  Neurological: Positive for weakness. Negative for sensory change, speech change, focal weakness and headaches.  Endo/Heme/Allergies: Does not bruise/bleed easily.  Psychiatric/Behavioral: Negative for depression. The patient is not nervous/anxious.     DRUG ALLERGIES:  No Known Allergies  VITALS:  Blood pressure (!) 158/74, pulse 67, temperature 98.3 F (36.8 C), temperature source Oral, resp. rate 16, height 5\' 8"  (1.727 m), weight 63.5 kg (140 lb), SpO2 100 %.  PHYSICAL EXAMINATION:   Physical Exam  GENERAL:  82 y.o.-year-old patient lying in the bed with no acute distress.  EYES: Pupils equal, round, reactive to light and accommodation. No scleral icterus. Extraocular muscles intact.  HEENT: Head atraumatic, normocephalic. Oropharynx and nasopharynx clear.  NECK:  Supple, no jugular venous distention. No thyroid enlargement, no tenderness.  LUNGS: Normal breath sounds bilaterally, no wheezing, rales, rhonchi. No use of accessory muscles of respiration.  CARDIOVASCULAR: S1, S2 normal. No  murmurs, rubs, or gallops.  ABDOMEN: Soft, nontender, nondistended. Bowel sounds present. No organomegaly or mass.  EXTREMITIES: No cyanosis, clubbing or edema b/l.    NEUROLOGIC:No focal Motor or sensory deficits b/l.   PSYCHIATRIC: The patient is alert and oriented x 3.  SKIN: No obvious rash, lesion, or ulcer.   LABORATORY PANEL:   CBC  Recent Labs Lab 06/23/16 0455  WBC 4.0  HGB 10.7*  HCT 32.0*  PLT 202   ------------------------------------------------------------------------------------------------------------------ Chemistries   Recent Labs Lab 06/22/16 1339 06/23/16 0455  NA 139 141  K 3.9 4.0  CL 107 107  CO2 24 28  GLUCOSE 100* 123*  BUN 19 23*  CREATININE 1.14 1.45*  CALCIUM 9.9 9.4  AST 23  --   ALT 9*  --   ALKPHOS 66  --   BILITOT 0.6  --    ------------------------------------------------------------------------------------------------------------------  Cardiac Enzymes No results for input(s): TROPONINI in the last 168 hours. ------------------------------------------------------------------------------------------------------------------  RADIOLOGY:  Ct Lumbar Spine Wo Contrast  Result Date: 06/22/2016 CLINICAL DATA:  Low back pain after fall 3 days ago at assisted living facility. EXAM: CT LUMBAR SPINE WITHOUT CONTRAST TECHNIQUE: Multidetector CT imaging of the lumbar spine was performed without intravenous contrast administration. Multiplanar CT image reconstructions were also generated. COMPARISON:  CT scan of December 20, 2015. FINDINGS: No fracture or spondylolisthesis is noted. Mild osteophyte formation is noted anteriorly at all levels of the lumbar spine. Mild degenerative disc disease is noted at L5-S1. Atherosclerosis of abdominal aorta is noted. No significant central spinal canal stenosis is noted. Several low densities are noted in the posterior portions of both iliac bones. IMPRESSION: Aortic atherosclerosis. Mild degenerative changes are  noted. No fracture or dislocation is noted.  Several lucencies are seen in the posterior portions of both iliac bones which potentially may represent metastatic disease. Clinical correlation is recommended. Electronically Signed   By: Marijo Conception, M.D.   On: 06/22/2016 13:00     ASSESSMENT AND PLAN:   Nilay Nikirk  is a 82 y.o. male with a known history of Hypertension, coronary artery disease, hyperlipidemia and cardiac murmur has had a non-syncopal fall 2 days ago. Following the fall he was reporting lower back pain. Gradually the pain is getting worse and patient is unable to ambulate as reported by the patient's caregiver.  # Acute lower back pain status post fall No acute fractures. Pain medications PRN Physical therapy consult Will likely need SNF  #Multiple iliac bone lucencies-most probably metastatic disease Consult palliative care.  #Essential hypertension Continue his home medication Toprol, Hytrin, hydralazine and titrate as needed  #Coronary artery disease Currently patient denies any chest pain. Continue his home medication including Toprol  #Hyperlipidemia check fasting lipid panel in a.m.  All the records are reviewed and case discussed with Care Management/Social Workerr. Management plans discussed with the patient, family and they are in agreement.  CODE STATUS: DNR  DVT Prophylaxis: SCDs  TOTAL TIME TAKING CARE OF THIS PATIENT: 35 minutes.   POSSIBLE D/C IN 1-2 DAYS, DEPENDING ON CLINICAL CONDITION.  Hillary Bow R M.D on 06/23/2016 at 1:10 PM  Between 7am to 6pm - Pager - [phone]  After 6pm go to [email or web address] - password [email or web address] [facility]  [facility]  Office  [phone]  CC: Primary care physician; No PCP Per Patient  Note: This dictation was prepared with Dragon dictation along with smaller phrase technology. Any transcriptional errors that result from this process are unintentional.

## 2016-06-23 NOTE — Clinical Social Work Placement (Signed)
   CLINICAL SOCIAL WORK PLACEMENT  NOTE  Date:  06/23/2016  Patient Details  Name: Victor Dillon MRN: [id number] Date of Birth: Dec 26, 1916  Clinical Social Work is seeking post-discharge placement for this patient at the [place] level of care (*CSW will initial, date and re-position this form in  chart as items are completed):  Yes   Patient/family provided with [place] Work Department's list of facilities offering this level of care within the geographic area requested by the patient (or if unable, by the patient's family).  Yes   Patient/family informed of their freedom to choose among providers that offer the needed level of care, that participate in Medicare, Medicaid or managed care program needed by the patient, have an available bed and are willing to accept the patient.  Yes   Patient/family informed of [facility]'s ownership interest in [facility] and [facility], as well as of the fact that they are under no obligation to receive care at these facilities.  PASRR submitted to EDS on 06/23/16     PASRR number received on 06/23/16     Existing PASRR number confirmed on       FL2 transmitted to all facilities in geographic area requested by pt/family on 06/23/16     FL2 transmitted to all facilities within larger geographic area on       Patient informed that his/her managed care company has contracts with or will negotiate with certain facilities, including the following:        Yes   Patient/family informed of bed offers received.  Patient chooses bed at  [facility] )     Physician recommends and patient chooses bed at      Patient to be transferred to   on  .  Patient to be transferred to facility by       Patient family notified on   of transfer.  Name of family member notified:        PHYSICIAN       Additional Comment:    _______________________________________________ [place], Veronia Beets,  LCSW 06/23/2016, 2:33 PM

## 2016-06-23 NOTE — Progress Notes (Signed)
Enoxaparin   Patient qualifies for Enoxaparin 30  mg SQ daily based on CrCl <30 ml/min per policy. Will change to Enoxaparin 30 mg SQ daily.  Afia Messenger D. Artie Mcintyre, PharmD   

## 2016-06-23 NOTE — Care Management (Addendum)
Contacted by Mr Victor Dillon who is POA for th patient. Was informed by Mr Victor Dillon that I should contact Victor Dillon concerning discharge plan. He stated that the [place] had authorized the patient to come to [facility] and that [facility] would have a bed for him at discharge. I have attempted to call Victor Dillon at [phone] and left voicemail.  I informed CSW Victor Dillon of this and she is following case. CSW is contacting [facility]. More to follow. Faxed H and P and Favce sheet to [place] at [facility].

## 2016-06-23 NOTE — NC FL2 (Signed)
[place] LEVEL OF CARE SCREENING TOOL     IDENTIFICATION  Patient Name: Victor Dillon Birthdate: [birth year] Sex: male Admission Date (Current Location): 06/22/2016  [place] and [place] Number:  Engineering geologist and Address:  [facility], [address] [place], [place] [address]      Provider Number: [id number]  Attending Physician Name and Address:  Hillary Bow, MD  Relative Name and Phone Number:       Current Level of Care: Hospital Recommended Level of Care: [place] Prior Approval Number:    Date Approved/Denied:   PASRR Number:  ([id number])  Discharge Plan: SNF    Current Diagnoses: Patient Active Problem List   Diagnosis Date Noted  . Acute low back pain 06/22/2016  . Lower GI bleed 12/20/2015  . Rectal bleed 12/15/2015  . Chest pain 07/17/2015    Orientation RESPIRATION BLADDER Height & Weight     Self, Time, Situation, Place  Normal Continent Weight: 140 lb (63.5 kg) Height:  5\' 8"  (172.7 cm)  BEHAVIORAL SYMPTOMS/MOOD NEUROLOGICAL BOWEL NUTRITION STATUS   (none )  (none ) Continent Diet (Diet: Heart Healthy )  AMBULATORY STATUS COMMUNICATION OF NEEDS Skin   Extensive Assist Verbally Normal                       Personal Care Assistance Level of Assistance  Bathing, Feeding, Dressing Bathing Assistance: Limited assistance Feeding assistance: Independent Dressing Assistance: Limited assistance     Functional Limitations Info  Sight, Hearing, Speech Sight Info: Adequate Hearing Info: Adequate Speech Info: Adequate    SPECIAL CARE FACTORS FREQUENCY  PT (By licensed PT)     PT Frequency:  (5)              Contractures      Additional Factors Info  Code Status, Allergies Code Status Info:  (DNR ) Allergies Info:  (No Known Allergies. )           Current Medications (06/23/2016):  This is the current hospital active medication list Current Facility-Administered  Medications  Medication Dose Route Frequency Provider Last Rate Last Dose  . 0.9 %  sodium chloride infusion   Intravenous Continuous Hillary Bow, MD 100 mL/hr at 06/23/16 0901    . acetaminophen (TYLENOL) tablet 650 mg  650 mg Oral Q6H PRN Nicholes Mango, MD      . aspirin EC tablet 81 mg  81 mg Oral Daily Nicholes Mango, MD   81 mg at 06/23/16 0910  . bisacodyl (DULCOLAX) EC tablet 5 mg  5 mg Oral Daily PRN Nicholes Mango, MD      . calcium carbonate (TUMS - dosed in mg elemental calcium) chewable tablet 500 mg  500 mg Oral BID Nicholes Mango, MD   500 mg at 06/23/16 0910  . cholecalciferol (VITAMIN D) tablet 1,000 Units  1,000 Units Oral Daily Nicholes Mango, MD   1,000 Units at 06/23/16 0910  . docusate sodium (COLACE) capsule 200 mg  200 mg Oral BID Nicholes Mango, MD   200 mg at 06/23/16 0909  . enoxaparin (LOVENOX) injection 30 mg  30 mg Subcutaneous Q24H Srikar Sudini, MD      . finasteride (PROPECIA) tablet 2 mg  2 mg Oral Daily Nicholes Mango, MD   2 mg at 06/23/16 0914  . hydrALAZINE (APRESOLINE) tablet 50 mg  50 mg Oral TID Nicholes Mango, MD   50 mg at 06/23/16 0910  . lactulose (Eunice)  10 GM/15ML solution 30 g  30 g Oral BID PRN Nicholes Mango, MD      . lidocaine (LIDODERM) 5 % 1 patch  1 patch Transdermal Q24H Daymon Larsen, MD   1 patch at 06/22/16 1612  . metoprolol succinate (TOPROL-XL) 24 hr tablet 25 mg  25 mg Oral Daily Nicholes Mango, MD   25 mg at 06/23/16 0909  . morphine 4 MG/ML injection 4 mg  4 mg Intravenous Q4H PRN Nicholes Mango, MD   4 mg at 06/22/16 1939  . ondansetron (ZOFRAN) tablet 4 mg  4 mg Oral Q6H PRN Nicholes Mango, MD       Or  . ondansetron (ZOFRAN) injection 4 mg  4 mg Intravenous Q6H PRN Aruna Gouru, MD      . oxyCODONE (Oxy IR/ROXICODONE) immediate release tablet 5 mg  5 mg Oral Q6H PRN Aruna Gouru, MD      . polyethylene glycol (MIRALAX / GLYCOLAX) packet 17 g  17 g Oral Daily Nicholes Mango, MD   17 g at 06/23/16 0908  . senna (SENOKOT) tablet 17.2 mg  2 tablet Oral BID Nicholes Mango, MD   17.2 mg at 06/23/16 0909  . sodium phosphate (FLEET) 7-19 GM/118ML enema 1 enema  1 enema Rectal Once PRN Nicholes Mango, MD      . terazosin (HYTRIN) capsule 5 mg  5 mg Oral QHS Nicholes Mango, MD   5 mg at 06/22/16 2142     Discharge Medications: Please see discharge summary for a list of discharge medications.  Relevant Imaging Results:  Relevant Lab Results:   Additional Information  (SSN: [id number])  [facility], Veronia Beets, LCSW

## 2016-06-23 NOTE — Evaluation (Signed)
Physical Therapy Evaluation Patient Details Name: Victor Dillon MRN: [id number] DOB: 1917-03-20 Today's Date: 06/23/2016   History of Present Illness  Victor Dillon  is a 82 y.o. male with a known history of Hypertension, coronary artery disease, hyperlipidemia and cardiac murmur has had a non-syncopal fall 2 days ago. Following following the fall he was reporting lower back pain. Gradually the pain is getting worse and patient is unable to ambulate as reported by the patient's caregiver. Patient was seen and evaluated in the emergency department yesterday and x-ray of the lumbar spine did not show any abnormalities. Patient came back to the ED today as the pain is getting worse CT of the lumbar spine has revealed multiple iliac lucencies concerning for metastasis. Patient is resting comfortably during my examination and denies any nausea vomiting, denies any head trauma. Pt denies other falls in the last 12 months.  Clinical Impression  Pt reports high levels of pain with bed mobility, transfers, and short distance ambulation from bed to recliner. He takes extended time and requires considerable assistance due to pain. Pt reports 9-10/10 pain with all mobility. Pt unable to return to ILF at current level of function and will need SNF placement in order to facilitate safe return to prior level of function. Pt will benefit from skilled PT services to address deficits in strength, balance, and mobility in order to return to full function at home.      Follow Up Recommendations SNF    Equipment Recommendations  None recommended by PT    Recommendations for Other Services       Precautions / Restrictions Precautions Precautions: Fall Restrictions Weight Bearing Restrictions: No      Mobility  Bed Mobility Overal bed mobility: Needs Assistance Bed Mobility: Supine to Sit     Supine to sit: Min assist     General bed mobility comments: Pt perform bed mobility exceedingly slowly due to  pain. Requires cues for sequencing to minimize pain. Pt requires UE assist to come to upright sitting due to pain and difficult with trunk rotation  Transfers Overall transfer level: Needs assistance Equipment used: Rolling walker (2 wheeled) Transfers: Sit to/from Stand Sit to Stand: Min assist         General transfer comment: Pt demonstrates very slow and guarded transfers. He takes extended time to come up and requires cues for hand placement and sequencing. In standing pt reports intermittent L low back pain with certain movements  Ambulation/Gait Ambulation/Gait assistance: Min guard Ambulation Distance (Feet): 5 Feet Assistive device: Rolling walker (2 wheeled) Gait Pattern/deviations: Decreased step length - right;Decreased step length - left Gait velocity: Decreased Gait velocity interpretation: <1.8 ft/sec, indicative of risk for recurrent falls General Gait Details: Pt takes short shuffling steps to ambulate from bed to recliner. Requires heavy cues for sequencing with walker, cues, and to approximate to recliner prior to sitting. Pt moves very slowly due to increase in low back pain intermittently throughout ambulation.  Stairs            Wheelchair Mobility    Modified Rankin (Stroke Patients Only)       Balance Overall balance assessment: Needs assistance Sitting-balance support: No upper extremity supported Sitting balance-Leahy Scale: Good     Standing balance support: No upper extremity supported Standing balance-Leahy Scale: Fair Standing balance comment: Pt with limited stability secondary to pain.  Pertinent Vitals/Pain Pain Assessment: 0-10 Pain Score: 9  Pain Location: L low back with movement Pain Descriptors / Indicators: Aching Pain Intervention(s): Monitored during session;Premedicated before session    Home Living Family/patient expects to be discharged to:: Unsure Living Arrangements:  Alone Available Help at Discharge: Personal care attendant;Other (Comment) (2 hours/day) Type of Home: Apartment Home Access: Elevator     Home Layout: One level Home Equipment: Walker - 4 wheels;Toilet riser;Grab bars - tub/shower;Grab bars - toilet (no BSC, no wc, no hospital bed, no 2 wheeled walker)      Prior Function Level of [place]: Needs assistance   Gait / Transfers Assistance Needed: Ambulates with rolling walker household distances.  ADL's / Homemaking Assistance Needed: Require assist from aids with ADLs/IADLs        Hand Dominance   Dominant Hand: Right    Extremity/Trunk Assessment   Upper Extremity Assessment: Generalized weakness           Lower Extremity Assessment: Generalized weakness (Difficult to assess due to pain acuity)         Communication   Communication: No difficulties  Cognition Arousal/Alertness: Awake/alert Behavior During Therapy: WFL for tasks assessed/performed Overall Cognitive Status: Within Functional Limits for tasks assessed                      General Comments      Exercises        Assessment/Plan    PT Assessment Patient needs continued PT services  PT Diagnosis Difficulty walking;Generalized weakness;Acute pain   PT Problem List Decreased strength;Decreased activity tolerance;Decreased mobility;Pain  PT Treatment Interventions DME instruction;Gait training;Functional mobility training;Therapeutic activities;Therapeutic exercise;Balance training;Neuromuscular re-education;Patient/family education;Manual techniques   PT Goals (Current goals can be found in the Care Plan section) Acute Rehab PT Goals Patient Stated Goal: Return to prior level of function PT Goal Formulation: With patient Time For Goal Achievement: 07/07/16 Potential to Achieve Goals: Fair    Frequency Min 2X/week   Barriers to discharge Decreased caregiver support Lives alone at independent living apartment at [place] During Treatment: Gait belt Activity Tolerance: Patient limited by pain Patient left: in chair;with call bell/phone within reach;with chair alarm set Nurse Communication: Mobility status         Time: [id number] PT Time Calculation (min) (ACUTE ONLY): 34 min   Charges:   PT Evaluation $PT Eval Low Complexity: 1 Procedure PT Treatments $Therapeutic Activity: 8-22 mins   PT G Codes:       Leyan Branden D Vester Titsworth PT, DPT   Audine Mangione 06/23/2016, 1:20 PM

## 2016-06-24 DIAGNOSIS — Z66 Do not resuscitate: Secondary | ICD-10-CM

## 2016-06-24 DIAGNOSIS — Z515 Encounter for palliative care: Secondary | ICD-10-CM

## 2016-06-24 DIAGNOSIS — M545 Low back pain: Principal | ICD-10-CM

## 2016-06-24 LAB — BASIC METABOLIC PANEL
Anion gap: 3 — ABNORMAL LOW (ref 5–15)
BUN: 30 mg/dL — AB (ref 6–20)
CHLORIDE: 111 mmol/L (ref 101–111)
CO2: 26 mmol/L (ref 22–32)
Calcium: 9.2 mg/dL (ref 8.9–10.3)
Creatinine, Ser: 1.48 mg/dL — ABNORMAL HIGH (ref 0.61–1.24)
GFR calc non Af Amer: 38 mL/min — ABNORMAL LOW (ref >60)
GFR, EST AFRICAN AMERICAN: 44 mL/min — AB (ref >60)
Glucose, Bld: 95 mg/dL (ref 65–99)
POTASSIUM: 4 mmol/L (ref 3.5–5.1)
SODIUM: 140 mmol/L (ref 135–145)

## 2016-06-24 MED ORDER — OXYCODONE HCL 5 MG PO TABS
5.0000 mg | ORAL_TABLET | ORAL | Status: DC | PRN
  Administered 2016-06-24: 5 mg via ORAL
  Filled 2016-06-24: qty 1

## 2016-06-24 MED ORDER — ACETAMINOPHEN 325 MG PO TABS
650.0000 mg | ORAL_TABLET | Freq: Three times a day (TID) | ORAL | Status: DC
  Administered 2016-06-24 – 2016-06-25 (×5): 650 mg via ORAL
  Filled 2016-06-24 (×4): qty 2

## 2016-06-24 NOTE — Progress Notes (Signed)
Clinical Social Worker (CSW) met with patient and his friends Blanch Media and Glendell Docker were at bedside. Glendell Docker brought POA paper work. CSW explained to Glendell Docker that he is not patient's guardian. CSW explained the difference between guardianship and POA. CSW explained how to obtain guardianship. Plan is for patient to D/C to [facility] tomorrow pending medical clearance. Patient, Blanch Media and Glendell Docker are in agreement with the plan. CSW left [facility] admissions coordinator at [facility] a voicemail making her aware of above. CSW will continue to follow and assist as needed.   [employer], LCSW [phone]

## 2016-06-24 NOTE — Consult Note (Signed)
Consultation Note Date: 06/24/2016   Patient Name: Victor Dillon  DOB: 01-Nov-1917  MRN: [id number]  Age / Sex: 82 y.o., male  PCP: No Pcp Per Patient Referring Physician: Nicholes Mango, MD  Reason for Consultation: Establishing goals of care, Pain control and Psychosocial/spiritual support  HPI/Patient Profile: 82 y.o. male  admitted on 06/22/2016 with  82 y.o. male with a known history of Hypertension, coronary artery disease, hyperlipidemia and cardiac murmur has had a non-syncopal fall 2 days ago. Following following the fall he was reporting lower back pain. Gradually the pain is getting worse and patient is unable to ambulate as reported by the patient's caregiver. Patient was seen and evaluated in the emergency department yesterday and x-ray of the lumbar spine did not show any abnormalities. Patient came back to the ED today as the pain is getting worse CT of the lumbar spine has revealed multiple iliac lucencies concerning for metastasis. Patient is resting comfortably during my examination and denies any nausea vomiting, denies any head trauma  Clinical Assessment and Goals of Care:  This NP Wadie Lessen reviewed medical records, received report from team, assessed the patient and then meet at the patient's bedside along with his HPOA/Carl Janace Aris and caregiver Jancice  to discuss diagnosis, prognosis, GOC, EOL wishes disposition and options.  A detailed discussion was had today regarding advanced directives.  Concepts specific to code status, artifical feeding and hydration, continued IV antibiotics and rehospitalization was had.  The difference between a aggressive medical intervention path  and a palliative comfort care path for this patient at this time was had.  Values and goals of care important to patient and family were attempted to be elicited.  MOST form completed  Concept of Hospice and Palliative  Care were discussed  Natural trajectory and expectations at EOL were discussed.  Questions and concerns addressed.   Family encouraged to call with questions or concerns.  PMT will continue to support holistically.   SUMMARY OF RECOMMENDATIONS    Code Status/Advance Care Planning:  DNR  Symptom Management:   Pain: Tylenol 350 mg po tid  Palliative Prophylaxis:   Aspiration, Bowel Regimen, Frequent Pain Assessment and Oral Care  Additional Recommendations (Limitations, Scope, Preferences):  Full Comfort Care  Psycho-social/Spiritual:   Desire for further Chaplaincy support:no  Additional Recommendations: Education on Hospice  Prognosis:   < 6 months  Discharge Planning: [place] for rehab with Palliative care service follow-up      Primary Diagnoses: Present on Admission: . Acute low back pain   I have reviewed the medical record, interviewed the patient and family, and examined the patient. The following aspects are pertinent.  Past Medical History:  Diagnosis Date  . Coronary artery disease   . Hypercholesteremia   . Hypertension   . Murmur, heart    Social History   Social History  . Marital status: Single    Spouse name: N/A  . Number of children: N/A  . Years of education: N/A   Social History Main Topics  .  Smoking status: Former Research scientist (life sciences)  . Smokeless tobacco: Never Used  . Alcohol use No  . Drug use: No  . Sexual activity: Not Asked   Other Topics Concern  . None   Social History Narrative  . None   No family history on file. Scheduled Meds: . acetaminophen  650 mg Oral TID  . aspirin EC  81 mg Oral Daily  . calcium carbonate  500 mg Oral BID  . cholecalciferol  1,000 Units Oral Daily  . docusate sodium  200 mg Oral BID  . enoxaparin (LOVENOX) injection  30 mg Subcutaneous Q24H  . finasteride  2 mg Oral Daily  . hydrALAZINE  50 mg Oral TID  . lidocaine  1 patch Transdermal Q24H  . metoprolol succinate  25 mg Oral  Daily  . polyethylene glycol  17 g Oral Daily  . senna  2 tablet Oral BID  . terazosin  5 mg Oral QHS   Continuous Infusions:  PRN Meds:.bisacodyl, lactulose, ondansetron **OR** ondansetron (ZOFRAN) IV, oxyCODONE, sodium phosphate Medications Prior to Admission:  Prior to Admission medications   Medication Sig Start Date End Date Taking? Authorizing Provider  acetaminophen (TYLENOL) 325 MG tablet Take 650 mg by mouth every 4 (four) hours as needed.   Yes Historical Provider, MD  aspirin EC 81 MG tablet Take 81 mg by mouth daily.   Yes Historical Provider, MD  calcium carbonate (CALCIUM 600) 600 MG TABS tablet Take 600 mg by mouth 2 (two) times daily.   Yes Historical Provider, MD  Cholecalciferol 10000 units TABS Take 1 tablet by mouth daily.   Yes Historical Provider, MD  docusate sodium (COLACE) 100 MG capsule Take 2 capsules (200 mg total) by mouth 2 (two) times daily. 05/17/15  Yes Carrie Mew, MD  finasteride (PROPECIA) 1 MG tablet Take 1 mg by mouth daily.    Yes Historical Provider, MD  hydrALAZINE (APRESOLINE) 50 MG tablet Take 50 mg by mouth 3 (three) times daily.   Yes Historical Provider, MD  lactulose (CHRONULAC) 10 GM/15ML solution Take 45 mLs (30 g total) by mouth 2 (two) times daily as needed for mild constipation. 12/17/15  Yes Sital Mody, MD  lidocaine (LIDODERM) 5 % Place 1 patch onto the skin every 12 (twelve) hours. Remove & Discard patch within 12 hours or as directed by MD 06/20/16 06/20/17 Yes Rudene Re, MD  metoprolol succinate (TOPROL XL) 25 MG 24 hr tablet Take 1 tablet (25 mg total) by mouth daily. 07/18/15  Yes Srikar Sudini, MD  oxyCODONE (ROXICODONE) 5 MG immediate release tablet Take 1 tablet (5 mg total) by mouth every 6 (six) hours as needed for moderate pain or severe pain. 06/20/16 06/20/17 Yes Rudene Re, MD  senna (SENOKOT) 8.6 MG TABS tablet Take 2 tablets (17.2 mg total) by mouth 2 (two) times daily. 05/17/15  Yes Carrie Mew, MD    terazosin (HYTRIN) 5 MG capsule Take 5 mg by mouth at bedtime.   Yes Historical Provider, MD  polyethylene glycol (MIRALAX / GLYCOLAX) packet Take 17 g by mouth daily. 12/22/15   Dustin Flock, MD   No Known Allergies Review of Systems  Constitutional: Positive for fatigue.  Musculoskeletal: Positive for back pain.  Neurological: Positive for weakness.    Physical Exam  Constitutional: He appears cachectic. He appears ill.  Cardiovascular: Normal rate, regular rhythm and normal heart sounds.   Pulmonary/Chest: He has decreased breath sounds in the right lower field and the left lower field.  Neurological: He is  alert.  Skin: Skin is warm and dry.    Vital Signs: BP 134/72   Pulse 68   Temp 98.4 F (36.9 C) (Oral)   Resp 16   Ht 5\' 8"  (1.727 m)   Wt 63.5 kg (140 lb)   SpO2 97%   BMI 21.29 kg/m  Pain Assessment: No/denies pain   Pain Score: 0-No pain   SpO2: SpO2: 97 % O2 Device:SpO2: 97 % O2 Flow Rate: .   IO: Intake/output summary:  Intake/Output Summary (Last 24 hours) at 06/24/16 1134 Last data filed at 06/24/16 [id number]  Gross per 24 hour  Intake           861.67 ml  Output              150 ml  Net           711.67 ml    LBM:   Baseline Weight: Weight: 63.5 kg (140 lb) Most recent weight: Weight: 63.5 kg (140 lb)     Palliative Assessment/Data:   Flowsheet Rows   Flowsheet Row Most Recent Value  Intake Tab  Referral Department  Hospitalist  Unit at Time of Referral  Orthopedic Unit  Palliative Care Primary Diagnosis  Cardiac  Date Notified  06/23/16  Palliative Care Type  New Palliative care  Reason for referral  Clarify Goals of Care, Pain  Date of Admission  06/22/16  # of days IP prior to Palliative referral  1  Clinical Assessment  Psychosocial & Spiritual Assessment  Palliative Care Outcomes      Time In: 1100 Time Out: 1200 Time Total: 60 min Greater than 50%  of this time was spent counseling and coordinating care related to the above  assessment and plan.  Signed by: Wadie Lessen, NP   Please contact Palliative Medicine Team phone at [phone] for questions and concerns.  For individual provider: See Shea Evans

## 2016-06-24 NOTE — Progress Notes (Signed)
[place] [facility] [place] NAME: Victor Dillon    MR#:  [id number]  DATE OF BIRTH:  1917/05/07  SUBJECTIVE:  CHIEF COMPLAINT:   Chief Complaint  Patient presents with  . Back Pain   Pts right hip, back and knee pain are better today. Care giver at bedside and  REVIEW OF SYSTEMS:    Review of Systems  Constitutional: Positive for malaise/fatigue. Negative for chills and fever.  HENT: Negative for sore throat.   Eyes: Negative for blurred vision, double vision and pain.  Respiratory: Negative for cough, hemoptysis, shortness of breath and wheezing.   Cardiovascular: Negative for chest pain, palpitations, orthopnea and leg swelling.  Gastrointestinal: Negative for abdominal pain, constipation, diarrhea, heartburn, nausea and vomiting.  Genitourinary: Negative for dysuria and hematuria.  Musculoskeletal: Positive for back pain and joint pain.  Skin: Negative for rash.  Neurological: Positive for weakness. Negative for sensory change, speech change, focal weakness and headaches.  Endo/Heme/Allergies: Does not bruise/bleed easily.  Psychiatric/Behavioral: Negative for depression. The patient is not nervous/anxious.     DRUG ALLERGIES:  No Known Allergies  VITALS:  Blood pressure 134/72, pulse 68, temperature 98.4 F (36.9 C), temperature source Oral, resp. rate 16, height 5\' 8"  (1.727 m), weight 63.5 kg (140 lb), SpO2 97 %.  PHYSICAL EXAMINATION:   Physical Exam  GENERAL:  82 y.o.-year-old patient lying in the bed with no acute distress.  EYES: Pupils equal, round, reactive to light and accommodation. No scleral icterus. Extraocular muscles intact.  HEENT: Head atraumatic, normocephalic. Oropharynx and nasopharynx clear.  NECK:  Supple, no jugular venous distention. No thyroid enlargement, no tenderness.  LUNGS: Normal breath sounds bilaterally, no wheezing, rales, rhonchi. No use of accessory muscles of respiration.  CARDIOVASCULAR:  S1, S2 normal. No murmurs, rubs, or gallops.  ABDOMEN: Soft, nontender, nondistended. Bowel sounds present. No organomegaly or mass.  EXTREMITIES: No cyanosis, clubbing or edema b/l.    NEUROLOGIC:No focal Motor or sensory deficits b/l.   PSYCHIATRIC: The patient is alert and oriented x 3.  SKIN: No obvious rash, lesion, or ulcer.   LABORATORY PANEL:   CBC  Recent Labs Lab 06/23/16 0455  WBC 4.0  HGB 10.7*  HCT 32.0*  PLT 202   ------------------------------------------------------------------------------------------------------------------ Chemistries   Recent Labs Lab 06/22/16 1339  06/24/16 0323  NA 139  < > 140  K 3.9  < > 4.0  CL 107  < > 111  CO2 24  < > 26  GLUCOSE 100*  < > 95  BUN 19  < > 30*  CREATININE 1.14  < > 1.48*  CALCIUM 9.9  < > 9.2  AST 23  --   --   ALT 9*  --   --   ALKPHOS 66  --   --   BILITOT 0.6  --   --   < > = values in this interval not displayed. ------------------------------------------------------------------------------------------------------------------  Cardiac Enzymes No results for input(s): TROPONINI in the last 168 hours. ------------------------------------------------------------------------------------------------------------------  RADIOLOGY:  No results found.   ASSESSMENT AND PLAN:   Victor Dillon  is a 82 y.o. male with a known history of Hypertension, coronary artery disease, hyperlipidemia and cardiac murmur has had a non-syncopal fall 2 days ago. Following the fall he was reporting lower back pain. Gradually the pain is getting worse and patient is unable to ambulate as reported by the patient's caregiver.  # Acute lower back pain status post fall No acute fractures. Pain  medications PRN Physical therapy recommended SNF, discharge to [facility] in a.m.   #Multiple iliac bone lucencies-most probably metastatic disease Consult palliative care at SNF  #Essential hypertension Continue his home  medication Toprol, Hytrin, hydralazine and titrate as needed  #Coronary artery disease Currently patient denies any chest pain. Continue his home medication including Toprol  #Hyperlipidemia  LDL at 124 No interventions needed at this time as patient is 82 year old .  All the records are reviewed and case discussed with Care Management/Social Workerr. Management plans discussed with the patient, family and they are in agreement.  CODE STATUS: DNR  DVT Prophylaxis: SCDs  TOTAL TIME TAKING CARE OF THIS PATIENT: 35 minutes.   POSSIBLE D/C TO SNF IN AM , DEPENDING ON CLINICAL CONDITION.  Nicholes Mango M.D on 06/24/2016 at 1:56 PM  Between 7am to 6pm - Pager - [phone]  After 6pm go to [email or web address] - password [email or web address] [facility]  [place] [facility]  Office  [phone]  CC: Primary care physician; No PCP Per Patient  Note: This dictation was prepared with Dragon dictation along with smaller phrase technology. Any transcriptional errors that result from this process are unintentional.

## 2016-06-24 NOTE — Progress Notes (Signed)
New referral for Palliative [facility] consult at [facility] following discharge received from [facility]. Patient information faxed to [facility]. Thank you. Flo Shanks RN, BSN, [place] and [facility] of [place], [facility] [phone] c

## 2016-06-25 MED ORDER — BISACODYL 5 MG PO TBEC: 5.0000 mg | DELAYED_RELEASE_TABLET | Freq: Every day | ORAL | 0 refills | Status: AC | PRN

## 2016-06-25 MED ORDER — OXYCODONE HCL 5 MG PO TABS: 5.0000 mg | ORAL_TABLET | ORAL | 0 refills | Status: AC | PRN

## 2016-06-25 NOTE — Care Management Important Message (Signed)
Important Message  Patient Details  Name: Victor Dillon MRN: [id number] Date of Birth: 11-04-1917   Medicare Important Message Given:  Yes    Alvie Heidelberg, RN 06/25/2016, 10:41 AM

## 2016-06-25 NOTE — Discharge Instructions (Signed)
Activity as recommended by physical therapy Follow-up with primary care physician at the facility in 3-5 days Follow-up with palliative care in a week

## 2016-06-25 NOTE — Progress Notes (Signed)
Patient is medically stable for D/C to [facility] today. Per Neoma Laming admissions coordinator at [facility] patient will go to room 319. RN will call report to [facility] and arrange EMS for transport. Clinical Education officer, museum (CSW) sent D/C orders to [employer] via [employer]. Patient is aware of above. CSW contacted patient's POA Glendell Docker and friend Blanch Media and made then aware of above. Please reconsult if future social work needs arise. CSW signing off.   [employer], LCSW [phone]

## 2016-06-25 NOTE — Progress Notes (Signed)
Called report to [place] at [facility]. Patient going to [facility] room 319. IV removed, belongings packed. EMS called.

## 2016-06-25 NOTE — Clinical Social Work Placement (Signed)
   CLINICAL SOCIAL WORK PLACEMENT  NOTE  Date:  06/25/2016  Patient Details  Name: Victor Dillon MRN: [id number] Date of Birth: [birth year]  Clinical Social Work is seeking post-discharge placement for this patient at the [facility] level of care (*CSW will initial, date and re-position this form in  chart as items are completed):  Yes   Patient/family provided with [place] Work Department's list of facilities offering this level of care within the geographic area requested by the patient (or if unable, by the patient's family).  Yes   Patient/family informed of their freedom to choose among providers that offer the needed level of care, that participate in Medicare, Medicaid or managed care program needed by the patient, have an available bed and are willing to accept the patient.  Yes   Patient/family informed of [place]'s ownership interest in [facility] and [facility], as well as of the fact that they are under no obligation to receive care at these facilities.  PASRR submitted to EDS on 06/23/16     PASRR number received on 06/23/16     Existing PASRR number confirmed on       FL2 transmitted to all facilities in geographic area requested by pt/family on 06/23/16     FL2 transmitted to all facilities within larger geographic area on       Patient informed that his/her managed care company has contracts with or will negotiate with certain facilities, including the following:        Yes   Patient/family informed of bed offers received.  Patient chooses bed at  [facility] )     Physician recommends and patient chooses bed at      Patient to be transferred to  [facility] ) on 06/25/16.  Patient to be transferred to facility by  [facility] )     Patient family notified on 06/25/16 of transfer.  Name of family member notified:   (Patient's POA Glendell Docker and friend Blanch Media are aware of D/C today. )     PHYSICIAN        Additional Comment:    _______________________________________________ Calvina Liptak, Veronia Beets, LCSW 06/25/2016, 2:22 PM

## 2016-06-25 NOTE — Discharge Summary (Signed)
[place] at [place] NAME: Victor Dillon    MR#:  [id number]  DATE OF BIRTH:  [birth year]  DATE OF ADMISSION:  06/22/2016 ADMITTING PHYSICIAN: Nicholes Mango, MD  DATE OF DISCHARGE:  06/25/16 PRIMARY CARE PHYSICIAN: No PCP Per Patient    ADMISSION DIAGNOSIS:  Midline low back pain without sciatica [M54.5]  DISCHARGE DIAGNOSIS:  Active Problems:   Acute low back pain   Palliative care encounter   DNR (do not resuscitate)   SECONDARY DIAGNOSIS:   Past Medical History:  Diagnosis Date  . Coronary artery disease   . Hypercholesteremia   . Hypertension   . Murmur, heart     HOSPITAL COURSE:  Victor Dillon a 82 y.o. malewith a known history of Hypertension, coronary artery disease, hyperlipidemia and cardiac murmur has had a non-syncopal fall 2 days ago. Following the fall he was reporting lower back pain. Gradually the pain is getting worse and patient is unable to ambulate as reported by the patient's caregiver.Please review history and physical for details  # Acute lower back pain status post fall No acute fractures. Pain medications provided PRN Physical therapy recommended SNF, discharge to [facility] today. Palliative care to follow-up at the facility   #Multiple iliac bone lucencies-most probably metastatic disease Consult palliative care at SNF Patient was seen and evaluated by palliative care NP during the hospital course who has recommended full comfort care, prognosis is anticipated less than 6 months  #Essential hypertension Continue his home medication Toprol, Hytrin, hydralazine and titrate as needed  #Coronary artery disease Currently patient denies any chest pain. Continue his home medication including Toprol  #Hyperlipidemia  LDL at 124 No interventions needed at this time as patient is 82 year old .    DISCHARGE CONDITIONS:   fair  CONSULTS OBTAINED:   Palliative care  PROCEDURES  none  DRUG ALLERGIES:  No Known Allergies  DISCHARGE MEDICATIONS:   Current Discharge Medication List    START taking these medications   Details  bisacodyl (DULCOLAX) 5 MG EC tablet Take 1 tablet (5 mg total) by mouth daily as needed for moderate constipation. Qty: 30 tablet, Refills: 0      CONTINUE these medications which have CHANGED   Details  oxyCODONE (ROXICODONE) 5 MG immediate release tablet Take 1 tablet (5 mg total) by mouth every 4 (four) hours as needed for moderate pain, severe pain or breakthrough pain. Qty: 30 tablet, Refills: 0      CONTINUE these medications which have NOT CHANGED   Details  acetaminophen (TYLENOL) 325 MG tablet Take 650 mg by mouth every 4 (four) hours as needed.    aspirin EC 81 MG tablet Take 81 mg by mouth daily.    calcium carbonate (CALCIUM 600) 600 MG TABS tablet Take 600 mg by mouth 2 (two) times daily.    Cholecalciferol 10000 units TABS Take 1 tablet by mouth daily.    docusate sodium (COLACE) 100 MG capsule Take 2 capsules (200 mg total) by mouth 2 (two) times daily. Qty: 120 capsule, Refills: 0    finasteride (PROPECIA) 1 MG tablet Take 1 mg by mouth daily.     hydrALAZINE (APRESOLINE) 50 MG tablet Take 50 mg by mouth 3 (three) times daily.    lactulose (CHRONULAC) 10 GM/15ML solution Take 45 mLs (30 g total) by mouth 2 (two) times daily as needed for mild constipation. Qty: 240 mL, Refills: 0    lidocaine (LIDODERM) 5 % Place 1 patch  onto the skin every 12 (twelve) hours. Remove & Discard patch within 12 hours or as directed by MD Qty: 10 patch, Refills: 0    metoprolol succinate (TOPROL XL) 25 MG 24 hr tablet Take 1 tablet (25 mg total) by mouth daily. Qty: 30 tablet, Refills: 0    senna (SENOKOT) 8.6 MG TABS tablet Take 2 tablets (17.2 mg total) by mouth 2 (two) times daily. Qty: 120 each, Refills: 0    terazosin (HYTRIN) 5 MG capsule Take 5 mg by mouth at bedtime.    polyethylene glycol (MIRALAX / GLYCOLAX) packet  Take 17 g by mouth daily. Qty: 14 each, Refills: 0         DISCHARGE INSTRUCTIONS:   Activity as recommended by physical therapy Follow-up with primary care physician at the facility in 3-5 days Follow-up with palliative care in a week  DIET:  Regular diet as tolerated  DISCHARGE CONDITION:  Fair  ACTIVITY:  Activity as tolerated  OXYGEN:  Home Oxygen: No.   Oxygen Delivery: room air  DISCHARGE LOCATION:  nursing home   If you experience worsening of your admission symptoms, develop shortness of breath, life threatening emergency, suicidal or homicidal thoughts you must seek medical attention immediately by calling 911 or calling your MD immediately  if symptoms less severe.  You Must read complete instructions/literature along with all the possible adverse reactions/side effects for all the Medicines you take and that have been prescribed to you. Take any new Medicines after you have completely understood and accpet all the possible adverse reactions/side effects.   Please note  You were cared for by a hospitalist during your hospital stay. If you have any questions about your discharge medications or the care you received while you were in the hospital after you are discharged, you can call the unit and asked to speak with the hospitalist on call if the hospitalist that took care of you is not available. Once you are discharged, your primary care physician will handle any further medical issues. Please note that NO REFILLS for any discharge medications will be authorized once you are discharged, as it is imperative that you return to your primary care physician (or establish a relationship with a primary care physician if you do not have one) for your aftercare needs so that they can reassess your need for medications and monitor your lab values.     Today  Chief Complaint  Patient presents with  . Back Pain    Patient's back pain is tolerable. Tolerating diet. No  overnight events   ROS:  CONSTITUTIONAL: Denies fevers, chills. Denies any fatigue, weakness.  EYES: Denies blurry vision, double vision, eye pain. EARS, NOSE, THROAT: Denies tinnitus, ear pain, hearing loss. RESPIRATORY: Denies cough, wheeze, shortness of breath.  CARDIOVASCULAR: Denies chest pain, palpitations, edema.  GASTROINTESTINAL: Denies nausea, vomiting, diarrhea, abdominal pain. Denies bright red blood per rectum. GENITOURINARY: Denies dysuria, hematuria. ENDOCRINE: Denies nocturia or thyroid problems. HEMATOLOGIC AND LYMPHATIC: Denies easy bruising or bleeding. SKIN: Denies rash or lesion. MUSCULOSKELETAL: Denies pain in neck, back, shoulder, knees, hips or arthritic symptoms.  NEUROLOGIC: Denies paralysis, paresthesias.  PSYCHIATRIC: Denies anxiety or depressive symptoms.   VITAL SIGNS:  Blood pressure (!) 159/65, pulse (!) 58, temperature 98.2 F (36.8 C), temperature source Oral, resp. rate 17, height 5\' 8"  (1.727 m), weight 63.5 kg (140 lb), SpO2 98 %.  I/O:    Intake/Output Summary (Last 24 hours) at 06/25/16 1249 Last data filed at 06/25/16 0827  Gross  per 24 hour  Intake              120 ml  Output              200 ml  Net              -80 ml    PHYSICAL EXAMINATION:  GENERAL:  82 y.o.-year-old patient lying in the bed with no acute distress.  EYES: Pupils equal, round, reactive to light and accommodation. No scleral icterus. Extraocular muscles intact.  HEENT: Head atraumatic, normocephalic. Oropharynx and nasopharynx clear.  NECK:  Supple, no jugular venous distention. No thyroid enlargement, no tenderness.  LUNGS: Normal breath sounds bilaterally, no wheezing, rales,rhonchi or crepitation. No use of accessory muscles of respiration.  CARDIOVASCULAR: S1, S2 normal. No murmurs, rubs, or gallops.  ABDOMEN: Soft, non-tender, non-distended. Bowel sounds present. No organomegaly or mass. Diffuse low back tenderness no paravertebral tenderness EXTREMITIES: No  pedal edema, cyanosis, or clubbing.  NEUROLOGIC: Cranial nerves II through XII are intact. Muscle strength 5/5 in all extremities. Sensation intact. Gait not checked.  PSYCHIATRIC: The patient is alert and oriented x 3.  SKIN: No obvious rash, lesion, or ulcer.   DATA REVIEW:   CBC  Recent Labs Lab 06/23/16 0455  WBC 4.0  HGB 10.7*  HCT 32.0*  PLT 202    Chemistries   Recent Labs Lab 06/22/16 1339  06/24/16 0323  NA 139  < > 140  K 3.9  < > 4.0  CL 107  < > 111  CO2 24  < > 26  GLUCOSE 100*  < > 95  BUN 19  < > 30*  CREATININE 1.14  < > 1.48*  CALCIUM 9.9  < > 9.2  AST 23  --   --   ALT 9*  --   --   ALKPHOS 66  --   --   BILITOT 0.6  --   --   < > = values in this interval not displayed.  Cardiac Enzymes No results for input(s): TROPONINI in the last 168 hours.  Microbiology Results  Results for orders placed or performed during the hospital encounter of 06/22/16  MRSA PCR Screening     Status: None   Collection Time: 06/22/16  7:10 PM  Result Value Ref Range Status   MRSA by PCR NEGATIVE NEGATIVE Final    Comment:        The GeneXpert MRSA Assay (FDA approved for NASAL specimens only), is one component of a comprehensive MRSA colonization surveillance program. It is not intended to diagnose MRSA infection nor to guide or monitor treatment for MRSA infections.     RADIOLOGY:  Ct Lumbar Spine Wo Contrast  Result Date: 06/22/2016 CLINICAL DATA:  Low back pain after fall 3 days ago at assisted living facility. EXAM: CT LUMBAR SPINE WITHOUT CONTRAST TECHNIQUE: Multidetector CT imaging of the lumbar spine was performed without intravenous contrast administration. Multiplanar CT image reconstructions were also generated. COMPARISON:  CT scan of December 20, 2015. FINDINGS: No fracture or spondylolisthesis is noted. Mild osteophyte formation is noted anteriorly at all levels of the lumbar spine. Mild degenerative disc disease is noted at L5-S1. Atherosclerosis  of abdominal aorta is noted. No significant central spinal canal stenosis is noted. Several low densities are noted in the posterior portions of both iliac bones. IMPRESSION: Aortic atherosclerosis. Mild degenerative changes are noted. No fracture or dislocation is noted. Several lucencies are seen in the posterior portions of both iliac  bones which potentially may represent metastatic disease. Clinical correlation is recommended. Electronically Signed   By: Marijo Conception, M.D.   On: 06/22/2016 13:00    EKG:   Orders placed or performed during the hospital encounter of 06/11/16  . EKG 12-Lead  . EKG 12-Lead  . ED EKG  . ED EKG      Management plans discussed with the patient, family and they are in agreement.  CODE STATUS:     Code Status Orders        Start     Ordered   06/22/16 1753  Do not attempt resuscitation (DNR)  Continuous    Question Answer Comment  In the event of cardiac or respiratory ARREST Do not call a "code blue"   In the event of cardiac or respiratory ARREST Do not perform Intubation, CPR, defibrillation or ACLS   In the event of cardiac or respiratory ARREST Use medication by any route, position, wound care, and other measures to relive pain and suffering. May use oxygen, suction and manual treatment of airway obstruction as needed for comfort.   Comments RN may pronounce      06/22/16 1753    Code Status History    Date Active Date Inactive Code Status Order ID Comments User Context   12/20/2015  9:32 AM 12/22/2015  4:52 PM DNR [id number]  Dustin Flock, MD Inpatient   12/19/2015 12:28 PM 12/20/2015  9:32 AM Full Code [id number]  Harrie Foreman, MD Inpatient   12/15/2015  2:50 PM 12/17/2015  6:34 PM Full Code [id number]  Idalia Needle, RN Inpatient   07/17/2015  8:07 PM 07/18/2015  9:28 PM Full Code [id number]  Epifanio Lesches, MD ED    Advance Directive Documentation   [place] Most Recent Value  Type of Advance Directive  Out of facility DNR  (pink MOST or yellow form)  Pre-existing out of facility DNR order (yellow form or pink MOST form)  Yellow form placed in chart (order not valid for inpatient use)  "MOST" Form in Place?  No data      TOTAL TIME TAKING CARE OF THIS PATIENT: 45  minutes.   Note: This dictation was prepared with Dragon dictation along with smaller phrase technology. Any transcriptional errors that result from this process are unintentional.   @[facility] @  on 06/25/2016 at 12:49 PM  Between 7am to 6pm - Pager - [phone]  After 6pm go to [email or web address] - password [email or web address] [facility]  [place] [facility]  Office  [phone]  CC: Primary care physician; No PCP Per Patient

## 2017-10-22 DEATH — deceased

## 2018-01-10 IMAGING — NM NM GI BLOOD LOSS
2 series · 12 of 12 positions shown · non-contrast
Comparison: None.

CLINICAL DATA: GI bleed, last bleeding [DATE] a.m. today

EXAM:
NUCLEAR MEDICINE GASTROINTESTINAL BLEEDING SCAN
TECHNIQUE: Sequential abdominal images were obtained following intravenous
administration of 3c-00m labeled red blood cells.
RADIOPHARMACEUTICALS:  22.82 mCi 3c-00m in-vitro labeled red cells.

[Series 1000: gi bleed · 4.80mm/px · 6 of 60 frames shown (1 of 2)]
[frame 6/60]
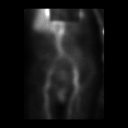
[frame 16/60]
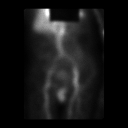
[frame 26/60]
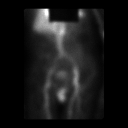
[frame 36/60]
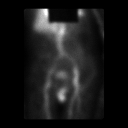
[frame 46/60]
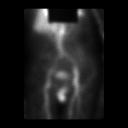
[frame 56/60]
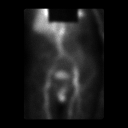

[Series 1000: gi bleed · 4.80mm/px · 6 of 60 frames shown (2 of 2)]
[frame 6/60]
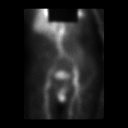
[frame 16/60]
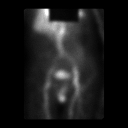
[frame 26/60]
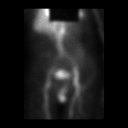
[frame 36/60]
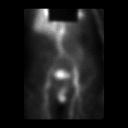
[frame 46/60]
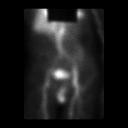
[frame 56/60]
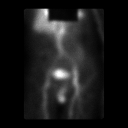

[12 of 12 positions shown; findings below may reference images not displayed]

FINDINGS: There is no abnormal radiotracer accumulation to suggest active GI
bleed or localize the GI bleed. Normal activity seen within the
blood pool, bladder and perineum.
IMPRESSION: No active GI bleeding detected.

## 2018-01-10 IMAGING — CR DG CHEST 1V PORT
1 series · 1 of 1 positions shown · non-contrast
Comparison: Chest radiograph performed 12/15/2015

CLINICAL DATA: Acute onset of left-sided chest pain, radiating to
the left side of the back. Rectal bleeding. Initial encounter.

EXAM:
PORTABLE CHEST 1 VIEW

[ap]
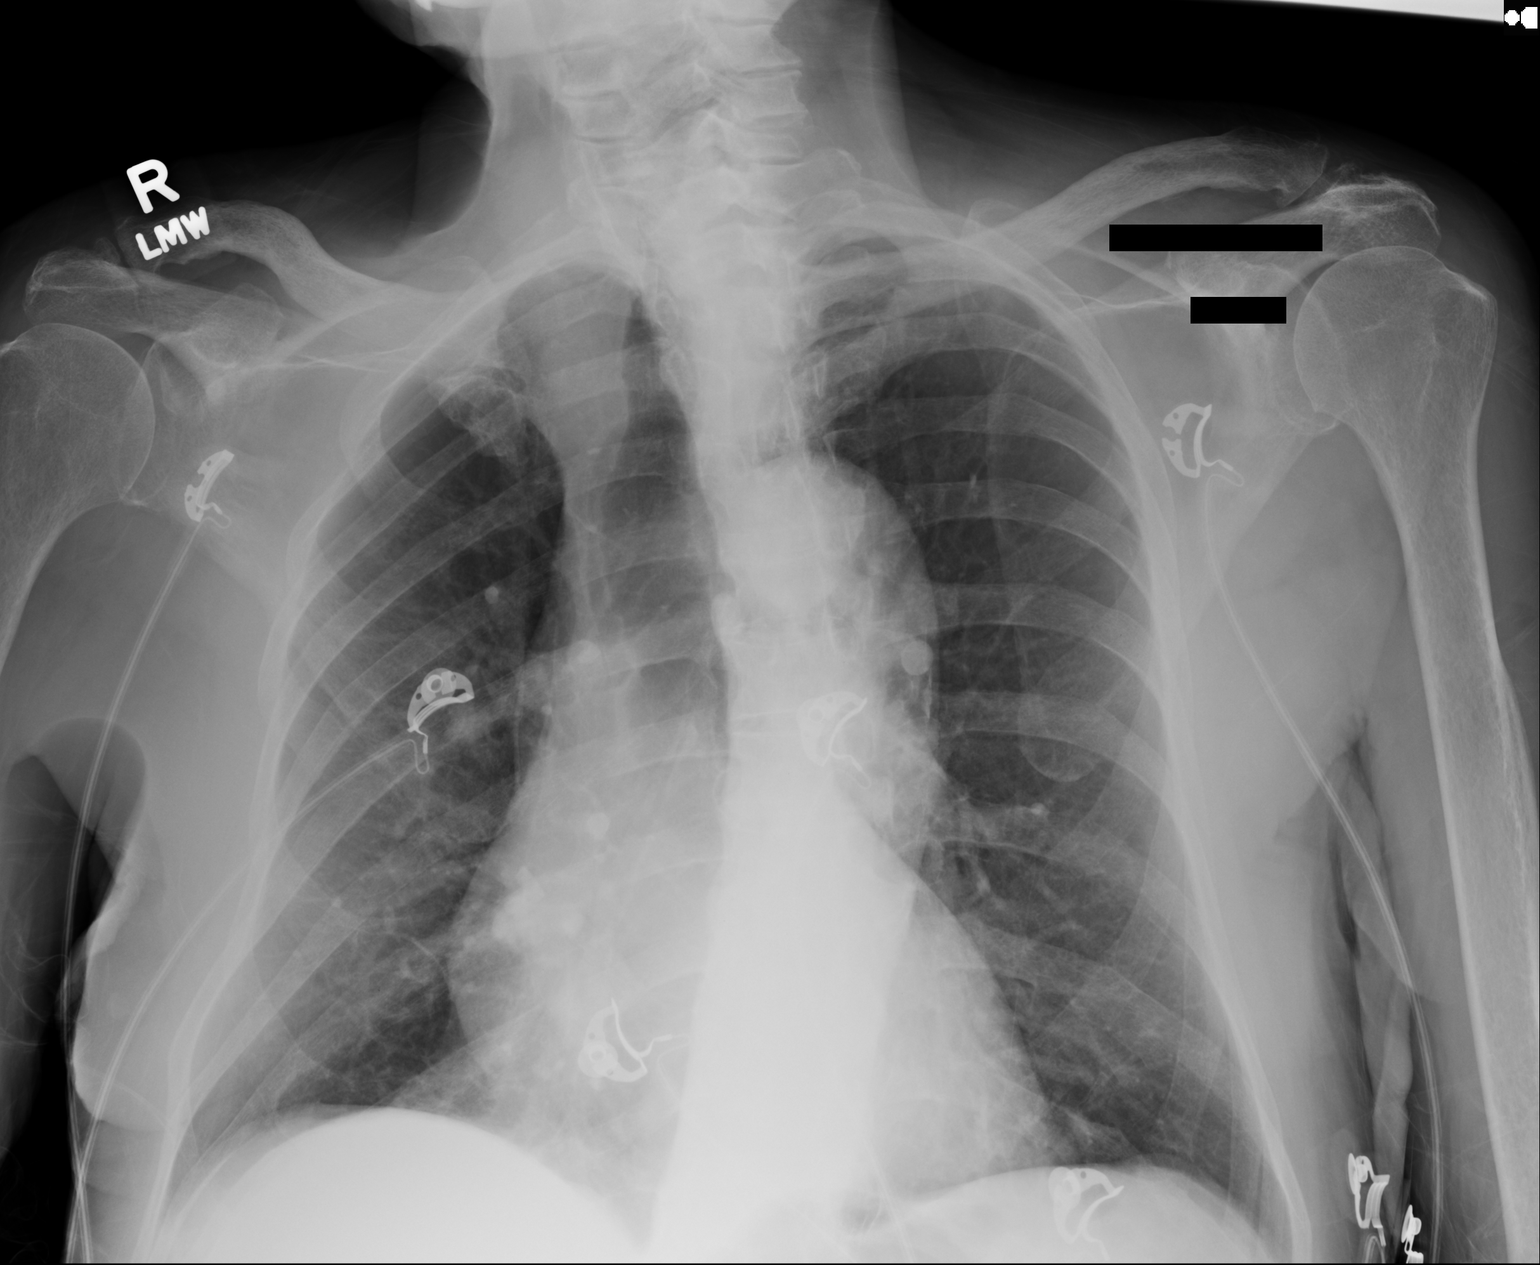

[1 of 1 positions shown; findings below may reference images not displayed]

FINDINGS: The lungs are well-aerated. Minimal bilateral atelectasis is noted.
There is no evidence of pleural effusion or pneumothorax.

The cardiomediastinal silhouette is borderline normal in size. Right
paratracheal prominence is relatively stable in appearance and
likely reflects normal vasculature. No acute osseous abnormalities
are seen.
IMPRESSION: Minimal bilateral atelectasis noted.  Lungs otherwise clear.
